# Patient Record
Sex: Female | Born: 1975 | Race: Black or African American | Hispanic: No | Marital: Single | Smoking: Never smoker
Health system: Southern US, Community
[De-identification: ages and names within clinical notes are randomized; demographics above are authoritative.]

---

## 2013-03-16 ENCOUNTER — Emergency Department: Payer: Self-pay | Admitting: Emergency Medicine

## 2015-06-24 DIAGNOSIS — I471 Supraventricular tachycardia, unspecified: Secondary | ICD-10-CM | POA: Insufficient documentation

## 2015-09-18 DIAGNOSIS — M47812 Spondylosis without myelopathy or radiculopathy, cervical region: Secondary | ICD-10-CM | POA: Insufficient documentation

## 2016-02-20 DIAGNOSIS — M2240 Chondromalacia patellae, unspecified knee: Secondary | ICD-10-CM | POA: Insufficient documentation

## 2016-10-08 DIAGNOSIS — S63641A Sprain of metacarpophalangeal joint of right thumb, initial encounter: Secondary | ICD-10-CM | POA: Insufficient documentation

## 2017-11-09 ENCOUNTER — Emergency Department (HOSPITAL_COMMUNITY)
Admission: EM | Admit: 2017-11-09 | Discharge: 2017-11-10 | Disposition: A | Discharge status: Home / Self Care | Payer: Commercial Managed Care - PPO | Attending: Emergency Medicine | Admitting: Emergency Medicine

## 2017-11-09 ENCOUNTER — Other Ambulatory Visit: Payer: Self-pay

## 2017-11-09 DIAGNOSIS — R5381 Other malaise: Secondary | ICD-10-CM | POA: Insufficient documentation

## 2017-11-09 DIAGNOSIS — M79602 Pain in left arm: Secondary | ICD-10-CM | POA: Diagnosis not present

## 2017-11-09 DIAGNOSIS — R110 Nausea: Secondary | ICD-10-CM | POA: Diagnosis not present

## 2017-11-09 NOTE — ED Triage Notes (Signed)
Pt presents to the ED from home with onset of left arm pain and "anxious" feeling.  States she used to be on anxiety medication.  Endorses feeling anxious lately in general. No frank chest pain just "aching" to her left arm.

## 2017-11-10 ENCOUNTER — Emergency Department (HOSPITAL_COMMUNITY): Payer: Commercial Managed Care - PPO

## 2017-11-10 LAB — I-STAT BETA HCG BLOOD, ED (MC, WL, AP ONLY): I-stat hCG, quantitative: <5 m[IU]/mL (ref <5)

## 2017-11-10 LAB — BASIC METABOLIC PANEL
Anion gap: 9 (ref 5–15)
BUN: 8 mg/dL (ref 6–20)
CHLORIDE: 106 mmol/L (ref 98–111)
CO2: 22 mmol/L (ref 22–32)
CREATININE: 0.88 mg/dL (ref 0.44–1.00)
Calcium: 8.9 mg/dL (ref 8.9–10.3)
Glucose, Bld: 128 mg/dL — ABNORMAL HIGH (ref 70–99)
POTASSIUM: 3.3 mmol/L — AB (ref 3.5–5.1)
Sodium: 137 mmol/L (ref 135–145)

## 2017-11-10 LAB — URINALYSIS, ROUTINE W REFLEX MICROSCOPIC
Bilirubin Urine: NEGATIVE
Glucose, UA: NEGATIVE mg/dL
Hgb urine dipstick: NEGATIVE
Ketones, ur: NEGATIVE mg/dL
Leukocytes, UA: NEGATIVE
Nitrite: NEGATIVE
Protein, ur: NEGATIVE mg/dL
Specific Gravity, Urine: 1.015 (ref 1.005–1.030)
pH: 7 (ref 5.0–8.0)

## 2017-11-10 LAB — CBC
HCT: 37.6 % (ref 36.0–46.0)
Hemoglobin: 12.7 g/dL (ref 12.0–15.0)
MCH: 30.4 pg (ref 26.0–34.0)
MCHC: 33.8 g/dL (ref 30.0–36.0)
MCV: 90 fL (ref 78.0–100.0)
Platelets: 366 10*3/uL (ref 150–400)
RBC: 4.18 MIL/uL (ref 3.87–5.11)
RDW: 12.9 % (ref 11.5–15.5)
WBC: 10.8 10*3/uL — ABNORMAL HIGH (ref 4.0–10.5)

## 2017-11-10 LAB — I-STAT TROPONIN, ED
Troponin i, poc: 0 ng/mL (ref 0.00–0.08)
Troponin i, poc: 0 ng/mL (ref 0.00–0.08)

## 2017-11-10 MED ORDER — ONDANSETRON 4 MG PO TBDP: 4.0000 mg | ORAL_TABLET | Freq: Three times a day (TID) | ORAL | 0 refills | Status: DC | PRN

## 2017-11-10 NOTE — Discharge Instructions (Signed)
Rest for today and tomorrow. Take Tylenol and/or ibuprofen for aches. Take Zofran for nausea as needed. Follow up with your doctor for recheck if symptoms persist or return here if symptoms worsen.

## 2017-11-10 NOTE — ED Notes (Signed)
ED Provider at bedside. 

## 2017-11-10 NOTE — ED Provider Notes (Signed)
[facility] EMERGENCY DEPARTMENT Provider Note   CSN: [id number] Arrival date & time: 11/09/17  2346     History   Chief Complaint Chief Complaint  Patient presents with  . Anxiety  . Arm Pain    HPI Heather Wright is a 42 y.o. female.  Patient here for evaluation of left arm ache and "just didn't feel well" while going to bed around 10:30 tonight. She had a normal day otherwise. No cough or fever. No SOB. She reports nausea that has been mild and intermittent. None currently. No chest pain at any time. No history chest pain, cardiac conditions.   The history is provided by the patient. No language interpreter was used.  Anxiety  Pertinent negatives include no chest pain, no abdominal pain and no shortness of breath.  Arm Pain  Pertinent negatives include no chest pain, no abdominal pain and no shortness of breath.    No past medical history on file.  There are no active problems to display for this patient.   OB History   None      Home Medications    Prior to Admission medications   Medication Sig Start Date End Date Taking? Authorizing Provider  ibuprofen (ADVIL,MOTRIN) 200 MG tablet Take 800 mg by mouth every 6 (six) hours as needed for moderate pain.   Yes [provider]  valACYclovir (VALTREX) 500 MG tablet Take 500 mg by mouth daily.   Yes [provider]  ondansetron (ZOFRAN ODT) 4 MG disintegrating tablet Take 1 tablet (4 mg total) by mouth every 8 (eight) hours as needed for nausea or vomiting. 11/10/17   Elpidio Anis, PA-C    Family History No family history on file.  Social History Social History   Tobacco Use  . Smoking status: Not on file  Substance Use Topics  . Alcohol use: Not on file  . Drug use: Not on file     Allergies   Bupropion; Sulfa antibiotics; and Amoxicillin   Review of Systems Review of Systems  Constitutional: Negative for chills and fever.  HENT: Negative.   Respiratory: Negative  for chest tightness and shortness of breath.   Cardiovascular: Negative for chest pain.  Gastrointestinal: Positive for nausea. Negative for abdominal pain.  Musculoskeletal:       C/o left arm ache.  Skin: Negative.   Neurological: Negative.      Physical Exam Updated Vital Signs BP 114/65   Pulse 68   Temp 98.9 F (37.2 C) (Oral)   Resp 18   LMP 10/20/2017   SpO2 96%   Physical Exam  Constitutional: She is oriented to person, place, and time. She appears well-developed and well-nourished.  HENT:  Head: Normocephalic.  Neck: Normal range of motion. Neck supple.  Cardiovascular: Normal rate and regular rhythm.  Pulmonary/Chest: Effort normal and breath sounds normal. She has no wheezes. She has no rales. She exhibits no tenderness.  Abdominal: Soft. Bowel sounds are normal. There is no tenderness. There is no rebound and no guarding.  Musculoskeletal: Normal range of motion. She exhibits no edema or tenderness.  Neurological: She is alert and oriented to person, place, and time.  Skin: Skin is warm and dry. No rash noted.  Psychiatric: She has a normal mood and affect.     ED Treatments / Results  Labs (all labs ordered are listed, but only abnormal results are displayed) Labs Reviewed  BASIC METABOLIC PANEL - Abnormal; Notable for the following components:  Result Value   Potassium 3.3 (*)    Glucose, Bld 128 (*)    All other components within normal limits  CBC - Abnormal; Notable for the following components:   WBC 10.8 (*)    All other components within normal limits  URINALYSIS, ROUTINE W REFLEX MICROSCOPIC  I-STAT TROPONIN, ED  I-STAT BETA HCG BLOOD, ED ([facility], [facility], [facility] ONLY)  I-STAT TROPONIN, ED   Results for orders placed or performed during the hospital encounter of 11/09/17  Basic metabolic panel  Result Value Ref Range   Sodium 137 135 - 145 mmol/L   Potassium 3.3 (L) 3.5 - 5.1 mmol/L   Chloride 106 98 - 111 mmol/L   CO2 22 22 - 32 mmol/L    Glucose, Bld 128 (H) 70 - 99 mg/dL   BUN 8 6 - 20 mg/dL   Creatinine, Ser 9.60 0.44 - 1.00 mg/dL   Calcium 8.9 8.9 - 45.4 mg/dL   GFR calc non Af Amer >60 >60 mL/min   GFR calc Af Amer >60 >60 mL/min   Anion gap 9 5 - 15  CBC  Result Value Ref Range   WBC 10.8 (H) 4.0 - 10.5 K/uL   RBC 4.18 3.87 - 5.11 MIL/uL   Hemoglobin 12.7 12.0 - 15.0 g/dL   HCT 09.8 11.9 - 14.7 %   MCV 90.0 78.0 - 100.0 fL   MCH 30.4 26.0 - 34.0 pg   MCHC 33.8 30.0 - 36.0 g/dL   RDW 82.9 56.2 - 13.0 %   Platelets 366 150 - 400 K/uL  Urinalysis, Routine w reflex microscopic  Result Value Ref Range   Color, Urine YELLOW YELLOW   APPearance CLEAR CLEAR   Specific Gravity, Urine 1.015 1.005 - 1.030   pH 7.0 5.0 - 8.0   Glucose, UA NEGATIVE NEGATIVE mg/dL   Hgb urine dipstick NEGATIVE NEGATIVE   Bilirubin Urine NEGATIVE NEGATIVE   Ketones, ur NEGATIVE NEGATIVE mg/dL   Protein, ur NEGATIVE NEGATIVE mg/dL   Nitrite NEGATIVE NEGATIVE   Leukocytes, UA NEGATIVE NEGATIVE  I-stat troponin, ED  Result Value Ref Range   Troponin i, poc 0.00 0.00 - 0.08 ng/mL   Comment 3          I-Stat beta hCG blood, ED  Result Value Ref Range   I-stat hCG, quantitative <5.0 <5 mIU/mL   Comment 3          I-Stat Troponin, ED (not at [facility])  Result Value Ref Range   Troponin i, poc 0.00 0.00 - 0.08 ng/mL   Comment 3            EKG None EKG: normal EKG, normal sinus rhythm.   Radiology Dg Chest 2 View  Result Date: 11/10/2017 CLINICAL DATA:  42 year old female with chest pain. EXAM: CHEST - 2 VIEW COMPARISON:  Chest radiograph dated 06/18/2015 FINDINGS: The heart size and mediastinal contours are within normal limits. Both lungs are clear. The visualized skeletal structures are unremarkable. IMPRESSION: No active cardiopulmonary disease. Electronically Signed   By: Elgie Collard M.D.   On: 11/10/2017 00:23    Procedures Procedures (including critical care time)  Medications Ordered in ED Medications - No data to  display   Initial Impression / Assessment and Plan / ED Course  I have reviewed the triage vital signs and the nursing notes.  Pertinent labs & imaging results that were available during my care of the patient were reviewed by me and considered in my medical decision making (see  chart for details).     Patient here with left arm ache and generally not feeling well since 10:30 pm 11/09/17 (4 hours ago). No significant risk factors for heart disease. Heart Score 0-1 based on symptoms. Troponin and repeat trop both negative.   She is felt stable for discharge home and is encouraged to follow up with her PCP for recheck of symptoms.  Final Clinical Impressions(s) / ED Diagnoses   Final diagnoses:  Nausea  Malaise  Left arm pain   1. Nausea. 2. Malaise 3. Left arm pain  ED Discharge Orders        Ordered    ondansetron (ZOFRAN ODT) 4 MG disintegrating tablet  Every 8 hours PRN     11/10/17 0311       Elpidio AnisUpstill, Kensly Bowmer, PA-C 11/10/17 0701    Derwood KaplanNanavati, Ankit, MD 11/10/17 0730

## 2019-08-12 ENCOUNTER — Emergency Department (HOSPITAL_COMMUNITY)
Admission: EM | Admit: 2019-08-12 | Discharge: 2019-08-12 | Disposition: A | Discharge status: Home / Self Care | Payer: Commercial Managed Care - PPO | Attending: Emergency Medicine | Admitting: Emergency Medicine

## 2019-08-12 ENCOUNTER — Other Ambulatory Visit: Payer: Self-pay

## 2019-08-12 ENCOUNTER — Emergency Department (HOSPITAL_COMMUNITY): Payer: Commercial Managed Care - PPO

## 2019-08-12 DIAGNOSIS — R002 Palpitations: Secondary | ICD-10-CM | POA: Diagnosis present

## 2019-08-12 DIAGNOSIS — I471 Supraventricular tachycardia: Secondary | ICD-10-CM | POA: Diagnosis not present

## 2019-08-12 LAB — BASIC METABOLIC PANEL
Anion gap: 9 (ref 5–15)
BUN: 10 mg/dL (ref 6–20)
CO2: 24 mmol/L (ref 22–32)
Calcium: 9.1 mg/dL (ref 8.9–10.3)
Chloride: 106 mmol/L (ref 98–111)
Creatinine, Ser: 0.93 mg/dL (ref 0.44–1.00)
GFR calc Af Amer: >60 mL/min (ref >60)
GFR calc non Af Amer: >60 mL/min (ref >60)
Glucose, Bld: 108 mg/dL — ABNORMAL HIGH (ref 70–99)
Potassium: 3.7 mmol/L (ref 3.5–5.1)
Sodium: 139 mmol/L (ref 135–145)

## 2019-08-12 LAB — CBC
HCT: 40.5 % (ref 36.0–46.0)
Hemoglobin: 13.6 g/dL (ref 12.0–15.0)
MCH: 30.6 pg (ref 26.0–34.0)
MCHC: 33.6 g/dL (ref 30.0–36.0)
MCV: 91.2 fL (ref 80.0–100.0)
Platelets: 349 10*3/uL (ref 150–400)
RBC: 4.44 MIL/uL (ref 3.87–5.11)
RDW: 12.6 % (ref 11.5–15.5)
WBC: 8.3 10*3/uL (ref 4.0–10.5)
nRBC: 0 % (ref 0.0–0.2)

## 2019-08-12 LAB — I-STAT BETA HCG BLOOD, ED (MC, WL, AP ONLY): I-stat hCG, quantitative: <5 m[IU]/mL (ref <5)

## 2019-08-12 LAB — TSH: TSH: 2.523 u[IU]/mL (ref 0.350–4.500)

## 2019-08-12 MED ORDER — METOPROLOL TARTRATE 25 MG PO TABS: 25.0000 mg | ORAL_TABLET | Freq: Two times a day (BID) | ORAL | 0 refills | Status: DC

## 2019-08-12 MED ORDER — METOPROLOL TARTRATE 5 MG/5ML IV SOLN: 5.0000 mg | Freq: Once | INTRAVENOUS | Status: DC

## 2019-08-12 MED ORDER — METOPROLOL TARTRATE 5 MG/5ML IV SOLN
5.0000 mg | Freq: Once | INTRAVENOUS | Status: AC
  Administered 2019-08-12: 08:00:00 5 mg via INTRAVENOUS
  Filled 2019-08-12: qty 5

## 2019-08-12 MED ORDER — METOPROLOL TARTRATE 5 MG/5ML IV SOLN
5.0000 mg | Freq: Once | INTRAVENOUS | Status: AC
  Administered 2019-08-12: 07:00:00 5 mg via INTRAVENOUS
  Filled 2019-08-12: qty 5

## 2019-08-12 MED ORDER — SODIUM CHLORIDE 0.9% FLUSH
3.0000 mL | Freq: Once | INTRAVENOUS | Status: AC
  Administered 2019-08-12: 3 mL via INTRAVENOUS

## 2019-08-12 NOTE — ED Notes (Signed)
Patient verbalizes understanding of discharge instructions. Opportunity for questioning and answers were provided. Armband removed by staff, pt discharged from ED. Ambulated out to lobby  

## 2019-08-12 NOTE — ED Provider Notes (Signed)
[place] EMERGENCY DEPARTMENT Provider Note   CSN: [id number] Arrival date & time: 08/12/19  0257     History Chief Complaint  Patient presents with  . Tachycardia    Heather Wright is a 44 y.o. female.  Patient is a 44 year old female with history of SVT presenting with complaints of palpitations. This started at approximately 130 this morning and woke her from sleep. She denies any chest pain or difficulty breathing. She denies any swelling in her legs. She has been seen by a cardiologist in [facility] in the past and was treated for presumed SVT. She had been on metoprolol, however she has been off of this for the past 2 years and has not had any issues during this period of time.  The history is provided by the patient.       No past medical history on file.  There are no problems to display for this patient.      OB History   No obstetric history on file.     No family history on file.  Social History   Tobacco Use  . Smoking status: Not on file  Substance Use Topics  . Alcohol use: Not on file  . Drug use: Not on file    Home Medications Prior to Admission medications   Medication Sig Start Date End Date Taking? Authorizing Provider  ibuprofen (ADVIL,MOTRIN) 200 MG tablet Take 800 mg by mouth every 6 (six) hours as needed for moderate pain.    [provider]  ondansetron (ZOFRAN ODT) 4 MG disintegrating tablet Take 1 tablet (4 mg total) by mouth every 8 (eight) hours as needed for nausea or vomiting. 11/10/17   Charlann Lange, PA-C  valACYclovir (VALTREX) 500 MG tablet Take 500 mg by mouth daily.    [provider]    Allergies    Bupropion, Sulfa antibiotics, and Amoxicillin  Review of Systems   Review of Systems  All other systems reviewed and are negative.   Physical Exam Updated Vital Signs BP (!) 141/96 (BP Location: Left Arm)   Pulse (!) 113   Temp 97.8 F (36.6 C) (Oral)   Resp 20   SpO2 99%    Physical Exam Vitals and nursing note reviewed.  Constitutional:      General: She is not in acute distress.    Appearance: She is well-developed. She is not diaphoretic.  HENT:     Head: Normocephalic and atraumatic.  Cardiovascular:     Rate and Rhythm: Regular rhythm. Tachycardia present.     Heart sounds: No murmur. No friction rub. No gallop.   Pulmonary:     Effort: Pulmonary effort is normal. No respiratory distress.     Breath sounds: Normal breath sounds. No wheezing.  Abdominal:     General: Bowel sounds are normal. There is no distension.     Palpations: Abdomen is soft.     Tenderness: There is no abdominal tenderness.  Musculoskeletal:        General: Normal range of motion.     Cervical back: Normal range of motion and neck supple.  Skin:    General: Skin is warm and dry.  Neurological:     Mental Status: She is alert and oriented to person, place, and time.     ED Results / Procedures / Treatments   Labs (all labs ordered are listed, but only abnormal results are displayed) Labs Reviewed  BASIC METABOLIC PANEL - Abnormal; Notable for the following components:  Result Value   Glucose, Bld 108 (*)    All other components within normal limits  CBC  TSH  I-STAT BETA HCG BLOOD, ED ([facility], [facility], [facility] ONLY)    EKG EKG Interpretation  Date/Time:  Saturday August 12 2019 03:06:37 EDT Ventricular Rate:  113 PR Interval:    QRS Duration: 58 QT Interval:  326 QTC Calculation: 447 R Axis:   80 Text Interpretation: Atrial Flutter T wave abnormality, consider inferior ischemia Abnormal ECG Confirmed by Geoffery Lyons ([id number]) on 08/12/2019 5:25:56 AM   Radiology DG Chest 2 View  Result Date: 08/12/2019 CLINICAL DATA:  Tachycardia. EXAM: CHEST - 2 VIEW COMPARISON:  Chest radiograph 11/10/2017 FINDINGS: The cardiomediastinal contours are normal. The lungs are clear. Pulmonary vasculature is normal. No consolidation, pleural effusion, or pneumothorax. No acute  osseous abnormalities are seen. EKG leads overlie the chest. IMPRESSION: Negative radiographs of the chest. Electronically Signed   By: Narda Rutherford M.D.   On: 08/12/2019 03:37    Procedures Procedures (including critical care time)  Medications Ordered in ED Medications  sodium chloride flush (NS) 0.9 % injection 3 mL (has no administration in time range)  metoprolol tartrate (LOPRESSOR) injection 5 mg (has no administration in time range)    ED Course  I have reviewed the triage vital signs and the nursing notes.  Pertinent labs & imaging results that were available during my care of the patient were reviewed by me and considered in my medical decision making (see chart for details).    MDM Rules/Calculators/A&P  Patient presenting here with complaints of palpitations.  She arrives with an EKG showing a possible atrial flutter versus junctional tachycardia.  Patient has had this in the past and had previously been on Lopressor.  Patient given IV Lopressor here in the ER and observed.  She remains in what appears to be a junctional tachycardia.  She will receive a second dose and undergo further observation.  I have discussed the care with Dr. Wyline Mood from cardiology who has reviewed her EKG and agrees with my assessment.  The plan at this time is to give additional Lopressor and discharged the patient with metoprolol.  Final Clinical Impression(s) / ED Diagnoses Final diagnoses:  None    Rx / DC Orders ED Discharge Orders    None       Geoffery Lyons, MD 08/12/19 [phone]

## 2019-08-12 NOTE — ED Provider Notes (Signed)
Care assumed from Dr. Judd Lien.  At time of transfer care, patient already received Lopressor and had reassuring work-up.  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  Plan of care is to watch patient for several hours to make sure she has no further episodes of the severe tachycardia and discharged home to follow-up with outpatient cardiology with beta-blocker prescription.  Patient was agreeable with this plan previously.  Anticipate reassessment and monitoring for new symptoms.  12:31 PM After several hours of observation, patient vital signs improved.  She had a blood pressure 121/86 on my evaluation and remained with a heart rate in the 90s.  She had improved symptoms.  Patient will follow up with cardiology and use the metoprolol prescription that was sent to her pharmacy.  She understands return precautions for new or worsened symptoms and will maintain hydration at home.  She had no other worsens or concerns and was discharged in good condition per previous plan.   Clinical Impression: 1. Palpitations   2. Junctional tachycardia (HCC)     Disposition: Discharge  Condition: Good  I have discussed the results, Dx and Tx plan with the pt(& family if present). He/she/they expressed understanding and agree(s) with the plan. Discharge instructions discussed at great length. Strict return precautions discussed and pt &/or family have verbalized understanding of the instructions. No further questions at time of discharge.    New Prescriptions   METOPROLOL TARTRATE (LOPRESSOR) 25 MG TABLET    Take 1 tablet (25 mg total) by mouth 2 (two) times daily.    Follow Up: No follow-up provider specified.    Joneisha Miles, Canary Brim, MD 08/12/19 [phone]

## 2019-08-12 NOTE — Discharge Instructions (Signed)
Begin taking metoprolol as prescribed.  Follow-up with your cardiologist next week, and return to the ER if symptoms significantly worsen or change.

## 2019-08-12 NOTE — ED Notes (Signed)
Pt tolerated PO fluids and crackers

## 2019-08-12 NOTE — ED Triage Notes (Signed)
Per pt she woke up with her heart racing about 1:30 am. Pt said no chest pain no sob. Pt is having some headaches and she feels like her heart is beating in her throat.

## 2019-08-18 ENCOUNTER — Ambulatory Visit: Payer: Commercial Managed Care - PPO | Admitting: Cardiology

## 2019-08-18 ENCOUNTER — Encounter: Payer: Self-pay | Admitting: Cardiology

## 2019-08-18 ENCOUNTER — Other Ambulatory Visit: Payer: Self-pay

## 2019-08-18 VITALS — BP 116/70 | HR 67 | Ht 65.0 in | Wt 155.8 lb

## 2019-08-18 DIAGNOSIS — R002 Palpitations: Secondary | ICD-10-CM | POA: Diagnosis not present

## 2019-08-18 DIAGNOSIS — I471 Supraventricular tachycardia: Secondary | ICD-10-CM | POA: Diagnosis not present

## 2019-08-18 NOTE — Patient Instructions (Signed)
Medication Instructions:  NO CHANGES  *If you need a refill on your cardiac medications before your next appointment, please call your pharmacy*   Lab Work: NOT NEEDED   Testing/Procedures: WILL BE SCHEDULE AT [address] Your physician has requested that you have an echocardiogram. Echocardiography is a painless test that uses sound waves to create images of your heart. It provides your doctor with information about the size and shape of your heart and how well your heart's chambers and valves are working. This procedure takes approximately one hour. There are no restrictions for this procedure.  AND  Your physician has recommended that you wear a 14 DAY ZIO-PATCH monitor. The Zio patch cardiac monitor continuously records heart rhythm data for up to 14 days, this is for patients being evaluated for multiple types heart rhythms. For the first 24 hours post application, please avoid getting the Zio monitor wet in the shower or by excessive sweating during exercise. After that, feel free to carry on with regular activities. Keep soaps and lotions away from the ZIO XT Patch.  This will be mailed to you, please expect 7-10 days to receive.   [address] location - [address], [address].         Follow-Up: At [facility], you and your health needs are our priority.  As part of our continuing mission to provide you with exceptional heart care, we have created designated Provider Care Teams.  These Care Teams include your primary Cardiologist (physician) and Advanced Practice Providers (APPs -  Physician Assistants and Nurse Practitioners) who all work together to provide you with the care you need, when you need it.  We recommend signing up for the patient portal called "MyChart".  Sign up information is provided on this After Visit Summary.  MyChart is used to connect with patients for Virtual Visits (Telemedicine).  Patients are able to view lab/test results,  encounter notes, upcoming appointments, etc.  Non-urgent messages can be sent to your provider as well.   To learn more about what you can do with MyChart, go to [email or web address].    Your next appointment:   3 month(s)  The format for your next appointment:   In Person  Provider:   Christopher Schumann, MD   Other Instructions   ZIO XT- Long Term Monitor Instructions   Your physician has requested you wear your ZIO patch monitor___14 ____days.   This is a single patch monitor.  [employer] supplies one patch monitor per enrollment.  Additional stickers are not available.   Please do not apply patch if you will be having a Nuclear Stress Test, Echocardiogram, Cardiac CT, MRI, or Chest Xray during the time frame you would be wearing the monitor. The patch cannot be worn during these tests.  You cannot remove and re-apply the ZIO XT patch monitor.   Your ZIO patch monitor will be sent USPS Priority mail from [employer] directly to your home address. The monitor may also be mailed to a PO BOX if home delivery is not available.   It may take 3-5 days to receive your monitor after you have been enrolled.   Once you have received you monitor, please review enclosed instructions.  Your monitor has already been registered assigning a specific monitor serial # to you.   Applying the monitor   Shave hair from upper left chest.   Hold abrader disc by orange tab.  Rub abrader in 40 strokes over left upper   chest as indicated in your monitor instructions.   Clean area with 4 enclosed alcohol pads .  Use all pads to assure are is cleaned thoroughly.  Let dry.   Apply patch as indicated in monitor instructions.  Patch will be place under collarbone on left side of chest with arrow pointing upward.   Rub patch adhesive wings for 2 minutes.Remove white label marked "1".  Remove white label marked "2".  Rub patch adhesive wings for 2 additional minutes.   While looking in a mirror,  press and release button in center of patch.  A small green light will flash 3-4 times .  This will be your only indicator the monitor has been turned on.     Do not shower for the first 24 hours.  You may shower after the first 24 hours.   Press button if you feel a symptom. You will hear a small click.  Record Date, Time and Symptom in the Patient Log Book.   When you are ready to remove patch, follow instructions on last 2 pages of Patient Log Book.  Stick patch monitor onto last page of Patient Log Book.   Place Patient Log Book in [employer] box.  Use locking tab on box and tape box closed securely.  The [employer] has [employer] on it.  Please place in mailbox as soon as possible.  Your physician should have your test results approximately 7 days after the monitor has been mailed back to [facility].   Call [facility] Customer Care at [phone] if you have questions regarding your ZIO XT patch monitor.  Call them immediately if you see an orange light blinking on your monitor.   If your monitor falls off in less than 4 days contact our Monitor department at [phone].  If your monitor becomes loose or falls off after 4 days call [employer] at [phone] for suggestions on securing your monitor.

## 2019-08-18 NOTE — Progress Notes (Signed)
Cardiology Office Note:    Date:  08/18/2019   ID:  Franchot Erichsen, DOB 12-09-1975, MRN [id number]  PCP:  Verlon Au, MD  Cardiologist:  Little Ishikawa, MD  Electrophysiologist:  None   Referring MD: Verlon Au, MD   Chief Complaint  Patient presents with  . Irregular Heart Beat    History of Present Illness:    Heather Wright is a 44 y.o. female with a hx of SVT who had recent ED visit for palpitations.  Presented on 3/27 to the ED with palpitations.  She had been on metoprolol in the past for SVT, but had been off it for the past 2 years.  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  She was given IV metoprolol and discharged on p.o. metoprolol.  She reports that she was diagnosed with SVT 6 years ago.  She felt like her heart was pounding and went to the ED.  Episode lasted for about an hour.  Reports that she had multiple episodes of this after her diagnosis, but had not had an episode for the last 2 years.  On 3/27 she woke up at 1:30 AM and felt like she had been running.  She tried coughing and bearing down, but no change in her symptoms.  Reports heart rate was going from 90s to 110s.  She went to the ED, where she was given IV metoprolol and then discharged on p.o. metoprolol.  States that episode broke shortly after discharge from the ED. Since starting metoprolol 25 mg twice daily, she has not had further episodes.  No alcohol or smoking.  Rare caffeine intake.    No past medical history on file.    Current Medications: Current Meds  Medication Sig  . ibuprofen (ADVIL,MOTRIN) 200 MG tablet Take 800 mg by mouth every 6 (six) hours as needed for moderate pain.  . metoprolol tartrate (LOPRESSOR) 25 MG tablet Take 1 tablet (25 mg total) by mouth 2 (two) times daily.  . valACYclovir (VALTREX) 500 MG tablet Take 500 mg by mouth daily.     Allergies:   Bupropion, Sulfa antibiotics, and Amoxicillin   Social History   Socioeconomic History  .  Marital status: Single    Spouse name: Not on file  . Number of children: Not on file  . Years of education: Not on file  . Highest education level: Not on file  Occupational History  . Not on file  Tobacco Use  . Smoking status: Never Smoker  . Smokeless tobacco: Never Used  Substance and Sexual Activity  . Alcohol use: Not on file  . Drug use: Not on file  . Sexual activity: Not on file  Other Topics Concern  . Not on file  Social History Narrative  . Not on file   Social Determinants of Health   Financial Resource Strain:   . Difficulty of Paying Living Expenses:   Food Insecurity:   . Worried About Programme researcher, broadcasting/film/video in the Last Year:   . Barista in the Last Year:   Transportation Needs:   . Freight forwarder (Medical):   Marland Kitchen Lack of Transportation (Non-Medical):   Physical Activity:   . Days of Exercise per Week:   . Minutes of Exercise per Session:   Stress:   . Feeling of Stress :   Social Connections:   . Frequency of Communication with Friends and Family:   . Frequency of Social Gatherings with Friends and Family:   .  Attends Religious Services:   . Active Member of Clubs or Organizations:   . Attends Banker Meetings:   Marland Kitchen Marital Status:      Family History: The patient's family history is not on file.  ROS:   Please see the history of present illness.     All other systems reviewed and are negative.  EKGs/Labs/Other Studies Reviewed:    The following studies were reviewed today:   EKG:  EKG is ordered today.  The ekg ordered today demonstrates normal sinus rhythm, rate 67, no ST/T abnormalities  Recent Labs: 08/12/2019: BUN 10; Creatinine, Ser 0.93; Hemoglobin 13.6; Platelets 349; Potassium 3.7; Sodium 139; TSH 2.523  Recent Lipid Panel No results found for: CHOL, TRIG, HDL, CHOLHDL, VLDL, LDLCALC, LDLDIRECT  Physical Exam:    VS:  BP 116/70   Pulse 67   Ht 5\' 5"  (1.651 m)   Wt 155 lb 12.8 oz (70.7 kg)   LMP  08/08/2019   SpO2 96%   BMI 25.93 kg/m     Wt Readings from Last 3 Encounters:  08/18/19 155 lb 12.8 oz (70.7 kg)     GEN:  Well nourished, well developed in no acute distress HEENT: Normal NECK: No JVD; No carotid bruits LYMPHATICS: No lymphadenopathy CARDIAC: RRR, no murmurs, rubs, gallops RESPIRATORY:  Clear to auscultation without rales, wheezing or rhonchi  ABDOMEN: Soft, non-tender, non-distended MUSCULOSKELETAL:  No edema; No deformity  SKIN: Warm and dry NEUROLOGIC:  Alert and oriented x 3 PSYCHIATRIC:  Normal affect   ASSESSMENT:    1. Palpitations   2. SVT (supraventricular tachycardia) (HCC)    PLAN:     Palpitations/SVT:  Recent ED visit with palpitations, with EKG suggestive of junctional tachycardia.  Started on metoprolol 25 mg twice daily, has not had further episodes since starting metoprolol -TTE -Zio patch x14 days -Continue metoprolol 25 mg twice daily  RTC in 3 months  Medication Adjustments/Labs and Tests Ordered: Current medicines are reviewed at length with the patient today.  Concerns regarding medicines are outlined above.  Orders Placed This Encounter  Procedures  . LONG TERM MONITOR (3-14 DAYS)  . EKG 12-Lead  . ECHOCARDIOGRAM COMPLETE   No orders of the defined types were placed in this encounter.   Patient Instructions  Medication Instructions:  NO CHANGES  *If you need a refill on your cardiac medications before your next appointment, please call your pharmacy*   Lab Work: NOT NEEDED   Testing/Procedures: WILL BE SCHEDULE AT [address] Your physician has requested that you have an echocardiogram. Echocardiography is a painless test that uses sound waves to create images of your heart. It provides your doctor with information about the size and shape of your heart and how well your heart's chambers and valves are working. This procedure takes approximately one hour. There are no restrictions for this  procedure.  AND Your physician has recommended that you wear a 14  DAY ZIO-PATCH monitor. The Zio patch cardiac monitor continuously records heart rhythm data for up to 14 days, this is for patients being evaluated for multiple types heart rhythms. For the first 24 hours post application, please avoid getting the Zio monitor wet in the shower or by excessive sweating during exercise. After that, feel free to carry on with regular activities. Keep soaps and lotions away from the ZIO XT Patch.  This will be mailed to you, please expect 7-10 days to receive.  10/18/19 location - [address], [address].          Follow-Up: At [facility], you and your health needs are our priority.  As part of our continuing mission to provide you with exceptional heart care, we have created designated Provider Care Teams.  These Care Teams include your primary Cardiologist (physician) and Advanced Practice Providers (APPs -  Physician Assistants and Nurse Practitioners) who all work together to provide you with the care you need, when you need it.  We recommend signing up for the patient portal called "MyChart".  Sign up information is provided on this After Visit Summary.  MyChart is used to connect with patients for Virtual Visits (Telemedicine).  Patients are able to view lab/test results, encounter notes, upcoming appointments, etc.  Non-urgent messages can be sent to your provider as well.   To learn more about what you can do with MyChart, go to [email or web address].    Your next appointment:   3 month(s)  The format for your next appointment:   In Person  Provider:   Oswaldo Milian, MD   Other Instructions    Bryn Gulling- Long Term Monitor Instructions   Your physician has requested you wear your ZIO patch monitor__14_____days.   This is a single patch monitor.  [employer] supplies one patch monitor per enrollment.  Additional stickers are not available.   Please do not apply patch  if you will be having a Nuclear Stress Test, Echocardiogram, Cardiac CT, MRI, or Chest Xray during the time frame you would be wearing the monitor. The patch cannot be worn during these tests.  You cannot remove and re-apply the ZIO XT patch monitor.   Your ZIO patch monitor will be sent USPS Priority mail from [facility] directly to your home address. The monitor may also be mailed to a PO BOX if home delivery is not available.   It may take 3-5 days to receive your monitor after you have been enrolled.   Once you have received you monitor, please review enclosed instructions.  Your monitor has already been registered assigning a specific monitor serial # to you.   Applying the monitor   Shave hair from upper left chest.   Hold abrader disc by orange tab.  Rub abrader in 40 strokes over left upper chest as indicated in your monitor instructions.   Clean area with 4 enclosed alcohol pads .  Use all pads to assure are is cleaned thoroughly.  Let dry.   Apply patch as indicated in monitor instructions.  Patch will be place under collarbone on left side of chest with arrow pointing upward.   Rub patch adhesive wings for 2 minutes.Remove white label marked "1".  Remove white label marked "2".  Rub patch adhesive wings for 2 additional minutes.   While looking in a mirror, press and release button in center of patch.  A small green light will flash 3-4 times .  This will be your only indicator the monitor has been turned on.     Do not shower for the first 24 hours.  You may shower after the first 24 hours.   Press button if you feel a symptom. You will hear a small click.  Record Date, Time and Symptom in the Patient Log Book.   When you are ready to remove patch, follow instructions on last 2 pages of Patient Log Book.  Stick patch monitor onto last page of Patient Log Book.   Place Patient Log Book in [place] box.  Use locking tab on box and tape box closed securely.  The [employer] and  [employer] has [employer] on it.  Please place in mailbox as soon as possible.  Your physician should have your test results approximately 7 days after the monitor has been mailed back to [facility].   Call [facility] Customer Care at [phone] if you have questions regarding your ZIO XT patch monitor.  Call them immediately if you see an orange light blinking on your monitor.   If your monitor falls off in less than 4 days contact our Monitor department at [phone].  If your monitor becomes loose or falls off after 4 days call [employer] at [phone] for suggestions on securing your monitor.       Signed, Little Ishikawa, MD  08/18/2019 9:14 PM    [facility]

## 2019-08-21 ENCOUNTER — Telehealth: Payer: Self-pay | Admitting: Radiology

## 2019-08-21 NOTE — Telephone Encounter (Signed)
Enrolled patient for a 14 day Zio monitor to be mailed to patients home.  

## 2019-08-25 ENCOUNTER — Ambulatory Visit: Payer: Commercial Managed Care - PPO

## 2019-08-25 DIAGNOSIS — R002 Palpitations: Secondary | ICD-10-CM

## 2019-09-04 ENCOUNTER — Other Ambulatory Visit (HOSPITAL_COMMUNITY): Payer: Commercial Managed Care - PPO

## 2019-09-11 ENCOUNTER — Ambulatory Visit (HOSPITAL_COMMUNITY): Payer: Commercial Managed Care - PPO | Attending: Internal Medicine

## 2019-09-11 ENCOUNTER — Other Ambulatory Visit: Payer: Self-pay

## 2019-09-11 DIAGNOSIS — R002 Palpitations: Secondary | ICD-10-CM | POA: Insufficient documentation

## 2019-09-12 ENCOUNTER — Encounter: Payer: Self-pay | Admitting: *Deleted

## 2019-10-04 ENCOUNTER — Telehealth: Payer: Self-pay | Admitting: Cardiology

## 2019-10-04 MED ORDER — METOPROLOL TARTRATE 50 MG PO TABS: 50.0000 mg | ORAL_TABLET | Freq: Two times a day (BID) | ORAL | 3 refills | Status: DC

## 2019-10-04 NOTE — Telephone Encounter (Signed)
New message  Patient is returning call for her monitor results. Please give patient a call back.

## 2019-10-04 NOTE — Telephone Encounter (Signed)
Patient aware of monitor results and recommendations.   Updated rx sent to pharmacy and f/u appt made.  Advised if episodes continue, symptoms worsen, or become more frequent call office for sooner appt.   Patient verbalized understanding.

## 2019-11-26 NOTE — Progress Notes (Deleted)
Cardiology Office Note:    Date:  11/26/2019   ID:  Franchot Erichsen, DOB January 31, 1976, MRN [id number]  PCP:  Verlon Au, MD  Cardiologist:  Little Ishikawa, MD  Electrophysiologist:  None   Referring MD: Verlon Au, MD   No chief complaint on file.   History of Present Illness:    Heather Wright is a 44 y.o. female with a hx of SVT who presents for follow-up.  She was initially seen on 08/18/2019.  She had presented on 3/27 to the ED with palpitations.  She had been on metoprolol in the past for SVT, but had been off it for the past 2 years.  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  She was given IV metoprolol and discharged on p.o. metoprolol.  She reports that she was diagnosed with SVT in 2015.  She felt like her heart was pounding and went to the ED.  Episode lasted for about an hour.  Reports that she had multiple episodes of this after her diagnosis, but had not had an episode for the last 2 years.  On 3/27 she woke up at 1:30 AM and felt like she had been running.  She tried coughing and bearing down, but no change in her symptoms.  Reports heart rate was going from 90s to 110s.  She went to the ED, where she was given IV metoprolol and then discharged on p.o. metoprolol.  States that episode broke shortly after discharge from the ED. Since starting metoprolol 25 mg twice daily, she has not had further episodes.  No alcohol or smoking.  Rare caffeine intake.  Echocardiogram on 09/11/2019 showed normal biventricular function, mild mitral regurgitation.  Cardiac monitor on 09/26/2019 showed 2 episodes of SVT, longest lasting 1 hour 54 minutes with max heart rate 124 bpm.  No past medical history on file.    Current Medications: No outpatient medications have been marked as taking for the 11/27/19 encounter (Appointment) with Little Ishikawa, MD.     Allergies:   Bupropion, Sulfa antibiotics, and Amoxicillin   Social History   Socioeconomic History   . Marital status: Single    Spouse name: Not on file  . Number of children: Not on file  . Years of education: Not on file  . Highest education level: Not on file  Occupational History  . Not on file  Tobacco Use  . Smoking status: Never Smoker  . Smokeless tobacco: Never Used  Substance and Sexual Activity  . Alcohol use: Not on file  . Drug use: Not on file  . Sexual activity: Not on file  Other Topics Concern  . Not on file  Social History Narrative  . Not on file   Social Determinants of Health   Financial Resource Strain:   . Difficulty of Paying Living Expenses:   Food Insecurity:   . Worried About Programme researcher, broadcasting/film/video in the Last Year:   . Barista in the Last Year:   Transportation Needs:   . Freight forwarder (Medical):   Marland Kitchen Lack of Transportation (Non-Medical):   Physical Activity:   . Days of Exercise per Week:   . Minutes of Exercise per Session:   Stress:   . Feeling of Stress :   Social Connections:   . Frequency of Communication with Friends and Family:   . Frequency of Social Gatherings with Friends and Family:   . Attends Religious Services:   . Active Member of Clubs  or Organizations:   . Attends Banker Meetings:   Marland Kitchen Marital Status:      Family History: The patient's family history is not on file.  ROS:   Please see the history of present illness.     All other systems reviewed and are negative.  EKGs/Labs/Other Studies Reviewed:    The following studies were reviewed today:   EKG:  EKG is ordered today.  The ekg ordered today demonstrates normal sinus rhythm, rate 67, no ST/T abnormalities  Recent Labs: 08/12/2019: BUN 10; Creatinine, Ser 0.93; Hemoglobin 13.6; Platelets 349; Potassium 3.7; Sodium 139; TSH 2.523  Recent Lipid Panel No results found for: CHOL, TRIG, HDL, CHOLHDL, VLDL, LDLCALC, LDLDIRECT  Physical Exam:    VS:  There were no vitals taken for this visit.    Wt Readings from Last 3 Encounters:    08/18/19 155 lb 12.8 oz (70.7 kg)     GEN:  Well nourished, well developed in no acute distress HEENT: Normal NECK: No JVD; No carotid bruits LYMPHATICS: No lymphadenopathy CARDIAC: RRR, no murmurs, rubs, gallops RESPIRATORY:  Clear to auscultation without rales, wheezing or rhonchi  ABDOMEN: Soft, non-tender, non-distended MUSCULOSKELETAL:  No edema; No deformity  SKIN: Warm and dry NEUROLOGIC:  Alert and oriented x 3 PSYCHIATRIC:  Normal affect   ASSESSMENT:    No diagnosis found. PLAN:     Palpitations/SVT: Recent Zio patch x14 days showed 2 episodes of SVT, longest lasting nearly 2 hours with max rate 124 bpm.  Echocardiogram shows no structural heart disease. -Continue metoprolol 50 mg twice daily.  If persistent symptoms despite medical management then will plan on EP referral for evaluation for ablation  RTC in 3 months  Medication Adjustments/Labs and Tests Ordered: Current medicines are reviewed at length with the patient today.  Concerns regarding medicines are outlined above.  No orders of the defined types were placed in this encounter.  No orders of the defined types were placed in this encounter.   There are no Patient Instructions on file for this visit.   Signed, Little Ishikawa, MD  11/26/2019 9:33 PM    [facility]

## 2019-11-27 ENCOUNTER — Ambulatory Visit: Payer: Commercial Managed Care - PPO | Admitting: Cardiology

## 2020-01-14 NOTE — Progress Notes (Addendum)
Cardiology Office Note:    Date:  01/17/2020   ID:  Franchot Erichsen, DOB Sep 17, 1975, MRN [id number]  PCP:  Verlon Au, MD  Cardiologist:  Little Ishikawa, MD  Electrophysiologist:  None   Referring MD: Verlon Au, MD   Chief Complaint  Patient presents with  . Palpitations    History of Present Illness:    Heather Wright is a 44 y.o. female with a hx of SVT who presents for follow-up.  She was initially seen on 08/18/2019.  Presented on 08/12/19 to the ED with palpitations.  She had been on metoprolol in the past for SVT, but had been off it for the past 2 years.  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  She was given IV metoprolol and discharged on p.o. metoprolol.  She reports that she was diagnosed with SVT around 2015.  She felt like her heart was pounding and went to the ED.  Episode lasted for about an hour.  Reports that she had multiple episodes of this after her diagnosis, but had not had an episode for the last 2 years.  On 3/27 she woke up at 1:30 AM and felt like she had been running.  She tried coughing and bearing down, but no change in her symptoms.  Reports heart rate was going from 90s to 110s.  She went to the ED, where she was given IV metoprolol and then discharged on p.o. metoprolol.  States that episode broke shortly after discharge from the ED. Since starting metoprolol 25 mg twice daily, she has not had further episodes.  No alcohol or smoking.  Rare caffeine intake.  Echocardiogram on 09/11/2019 showed normal biventricular function, mild MR.  Zio patch x14 days showed 2 episodes of SVT, longest lasting 2 hours.  Since last clinic visit, she reports that since she increased her metoprolol to 50 mg twice daily, she has had one episode of SVT.  Occurred yesterday morning around 4 AM, states that it broke using vagal maneuvers.  She has not been taking her Lopressor dose in the evening, as states that it causes her to feel pressure in her head.   Has only been taking her morning dose.  She denies any lightheadedness, syncope, chest pain, or dyspnea.   No past medical history on file.    Current Medications: Current Meds  Medication Sig  . valACYclovir (VALTREX) 500 MG tablet Take 500 mg by mouth daily.  . [DISCONTINUED] metoprolol tartrate (LOPRESSOR) 50 MG tablet Take 1 tablet (50 mg total) by mouth 2 (two) times daily.     Allergies:   Bupropion, Sulfa antibiotics, and Amoxicillin   Social History   Socioeconomic History  . Marital status: Single    Spouse name: Not on file  . Number of children: Not on file  . Years of education: Not on file  . Highest education level: Not on file  Occupational History  . Not on file  Tobacco Use  . Smoking status: Never Smoker  . Smokeless tobacco: Never Used  Substance and Sexual Activity  . Alcohol use: Not on file  . Drug use: Not on file  . Sexual activity: Not on file  Other Topics Concern  . Not on file  Social History Narrative  . Not on file   Social Determinants of Health   Financial Resource Strain:   . Difficulty of Paying Living Expenses: Not on file  Food Insecurity:   . Worried About Programme researcher, broadcasting/film/video in the  Last Year: Not on file  . Ran Out of Food in the Last Year: Not on file  Transportation Needs:   . Lack of Transportation (Medical): Not on file  . Lack of Transportation (Non-Medical): Not on file  Physical Activity:   . Days of Exercise per Week: Not on file  . Minutes of Exercise per Session: Not on file  Stress:   . Feeling of Stress : Not on file  Social Connections:   . Frequency of Communication with Friends and Family: Not on file  . Frequency of Social Gatherings with Friends and Family: Not on file  . Attends Religious Services: Not on file  . Active Member of Clubs or Organizations: Not on file  . Attends Banker Meetings: Not on file  . Marital Status: Not on file     Family History: The patient's family history is  not on file.  ROS:   Please see the history of present illness.     All other systems reviewed and are negative.  EKGs/Labs/Other Studies Reviewed:    The following studies were reviewed today:   EKG:  EKG is ordered today.  The ekg ordered today demonstrates normal sinus rhythm, rate 58, no ST/T abnormalities, first degree AV block  Recent Labs: 08/12/2019: BUN 10; Creatinine, Ser 0.93; Hemoglobin 13.6; Platelets 349; Potassium 3.7; Sodium 139; TSH 2.523  Recent Lipid Panel No results found for: CHOL, TRIG, HDL, CHOLHDL, VLDL, LDLCALC, LDLDIRECT  Physical Exam:    VS:  BP (!) 104/58   Temp (!) 97.5 F (36.4 C) (Tympanic)   Ht 5\' 5"  (1.651 m)   Wt 159 lb 9.6 oz (72.4 kg)   BMI 26.56 kg/m     Wt Readings from Last 3 Encounters:  01/17/20 159 lb 9.6 oz (72.4 kg)  08/18/19 155 lb 12.8 oz (70.7 kg)     GEN:  Well nourished, well developed in no acute distress HEENT: Normal NECK: No JVD; No carotid bruits LYMPHATICS: No lymphadenopathy CARDIAC: RRR, no murmurs, rubs, gallops RESPIRATORY:  Clear to auscultation without rales, wheezing or rhonchi  ABDOMEN: Soft, non-tender, non-distended MUSCULOSKELETAL:  No edema; No deformity  SKIN: Warm and dry NEUROLOGIC:  Alert and oriented x 3 PSYCHIATRIC:  Normal affect   ASSESSMENT:    1. SVT (supraventricular tachycardia) (HCC)   2. Palpitations    PLAN:    Palpitations/SVT:  Recent ED visit with palpitations, with EKG suggestive of junctional tachycardia.  Started on metoprolol 25 mg twice daily.  Echocardiogram on 09/11/2019 showed normal biventricular function, mild MR.  Zio patch x14 days showed 2 episodes of SVT, longest lasting 2 hours.  Metoprolol subsequently increased to 50 mg twice daily, but reports difficulty tolerating, has only been taking once daily -Will switch to Toprol-XL 50 mg daily.  If unable to tolerate metoprolol or continuing to have episodes of SVT, will plan referral to EP for evaluation of  ablation  RTC in 3 months  Medication Adjustments/Labs and Tests Ordered: Current medicines are reviewed at length with the patient today.  Concerns regarding medicines are outlined above.  Orders Placed This Encounter  Procedures  . EKG 12-Lead   Meds ordered this encounter  Medications  . metoprolol succinate (TOPROL-XL) 50 MG 24 hr tablet    Sig: Take 1 tablet (50 mg total) by mouth daily. Take with or immediately following a meal.    Dispense:  90 tablet    Refill:  3    Patient Instructions  Medication Instructions:  STOP Lopressor START metoprolol succinate (Toprol XL) 50 mg once daily  *If you need a refill on your cardiac medications before your next appointment, please call your pharmacy*  Follow-Up: At [facility], you and your health needs are our priority.  As part of our continuing mission to provide you with exceptional heart care, we have created designated Provider Care Teams.  These Care Teams include your primary Cardiologist (physician) and Advanced Practice Providers (APPs -  Physician Assistants and Nurse Practitioners) who all work together to provide you with the care you need, when you need it.  We recommend signing up for the patient portal called "MyChart".  Sign up information is provided on this After Visit Summary.  MyChart is used to connect with patients for Virtual Visits (Telemedicine).  Patients are able to view lab/test results, encounter notes, upcoming appointments, etc.  Non-urgent messages can be sent to your provider as well.   To learn more about what you can do with MyChart, go to [email or web address].    Your next appointment:   3 month(s)  The format for your next appointment:   In Person  Provider:   Epifanio Lesches, MD       Signed, Little Ishikawa, MD  01/17/2020 11:37 AM     [facility]

## 2020-01-17 ENCOUNTER — Encounter: Payer: Self-pay | Admitting: Cardiology

## 2020-01-17 ENCOUNTER — Ambulatory Visit (INDEPENDENT_AMBULATORY_CARE_PROVIDER_SITE_OTHER): Payer: Commercial Managed Care - PPO | Admitting: Cardiology

## 2020-01-17 ENCOUNTER — Other Ambulatory Visit: Payer: Self-pay

## 2020-01-17 VITALS — BP 104/58 | Temp 97.5°F | Ht 65.0 in | Wt 159.6 lb

## 2020-01-17 DIAGNOSIS — I471 Supraventricular tachycardia: Secondary | ICD-10-CM | POA: Diagnosis not present

## 2020-01-17 DIAGNOSIS — R002 Palpitations: Secondary | ICD-10-CM

## 2020-01-17 MED ORDER — METOPROLOL SUCCINATE ER 50 MG PO TB24: 50.0000 mg | ORAL_TABLET | Freq: Every day | ORAL | 3 refills | Status: DC

## 2020-01-17 NOTE — Patient Instructions (Signed)
Medication Instructions:  STOP Lopressor START metoprolol succinate (Toprol XL) 50 mg once daily  *If you need a refill on your cardiac medications before your next appointment, please call your pharmacy*  Follow-Up: At [facility] you and your health needs are our priority.  As part of our continuing mission to provide you with exceptional heart care, we have created designated Provider Care Teams.  These Care Teams include your primary Cardiologist (physician) and Advanced Practice Providers (APPs -  Physician Assistants and Nurse Practitioners) who all work together to provide you with the care you need, when you need it.  We recommend signing up for the patient portal called "MyChart".  Sign up information is provided on this After Visit Summary.  MyChart is used to connect with patients for Virtual Visits (Telemedicine).  Patients are able to view lab/test results, encounter notes, upcoming appointments, etc.  Non-urgent messages can be sent to your provider as well.   To learn more about what you can do with MyChart, go to [email or web address].    Your next appointment:   3 month(s)  The format for your next appointment:   In Person  Provider:   Epifanio Lesches, MD

## 2020-03-01 DIAGNOSIS — I471 Supraventricular tachycardia: Secondary | ICD-10-CM

## 2020-03-18 ENCOUNTER — Encounter: Payer: Self-pay | Admitting: Internal Medicine

## 2020-03-18 ENCOUNTER — Ambulatory Visit: Payer: Commercial Managed Care - PPO | Admitting: Internal Medicine

## 2020-03-18 ENCOUNTER — Other Ambulatory Visit: Payer: Self-pay

## 2020-03-18 VITALS — BP 120/76 | HR 65 | Ht 65.0 in | Wt 159.4 lb

## 2020-03-18 DIAGNOSIS — I471 Supraventricular tachycardia: Secondary | ICD-10-CM | POA: Diagnosis not present

## 2020-03-18 NOTE — Progress Notes (Signed)
HPI Heather Wright is referred today by Dr. Gaynelle Arabian for evaluation of SVT. She is a pleasant 44 yo woman who notes a h/o heart racing for about 8 years associated with palpitations. She has not experienced syncope. She notes that her episodes start and stop suddenly. She can terminate them with vagal maneuvers. She has tried beta blockers but stopped them after a few months. Allergies  Allergen Reactions  . Bupropion Hives  . Sulfa Antibiotics Hives    Other reaction(s): HIVES Other reaction(s): HIVES   . Amoxicillin Rash    Other reaction(s): RASH Other reaction(s): RASH      Current Outpatient Medications  Medication Sig Dispense Refill  . cyclobenzaprine (FLEXERIL) 10 MG tablet Take 1 tablet by mouth as needed.    . metoprolol succinate (TOPROL-XL) 50 MG 24 hr tablet Take 1 tablet (50 mg total) by mouth daily. Take with or immediately following a meal. 90 tablet 3  . valACYclovir (VALTREX) 500 MG tablet Take 500 mg by mouth daily.     No current facility-administered medications for this visit.     History reviewed. No pertinent past medical history.  ROS:   All systems reviewed and negative except as noted in the HPI.   History reviewed. No pertinent surgical history.   History reviewed. No pertinent family history.   Social History   Socioeconomic History  . Marital status: Single    Spouse name: Not on file  . Number of children: Not on file  . Years of education: Not on file  . Highest education level: Not on file  Occupational History  . Not on file  Tobacco Use  . Smoking status: Never Smoker  . Smokeless tobacco: Never Used  Substance and Sexual Activity  . Alcohol use: Not on file  . Drug use: Not on file  . Sexual activity: Not on file  Other Topics Concern  . Not on file  Social History Narrative  . Not on file   Social Determinants of Health   Financial Resource Strain:   . Difficulty of Paying Living Expenses: Not on file  Food  Insecurity:   . Worried About Programme researcher, broadcasting/film/video in the Last Year: Not on file  . Ran Out of Food in the Last Year: Not on file  Transportation Needs:   . Lack of Transportation (Medical): Not on file  . Lack of Transportation (Non-Medical): Not on file  Physical Activity:   . Days of Exercise per Week: Not on file  . Minutes of Exercise per Session: Not on file  Stress:   . Feeling of Stress : Not on file  Social Connections:   . Frequency of Communication with Friends and Family: Not on file  . Frequency of Social Gatherings with Friends and Family: Not on file  . Attends Religious Services: Not on file  . Active Member of Clubs or Organizations: Not on file  . Attends Banker Meetings: Not on file  . Marital Status: Not on file  Intimate Partner Violence:   . Fear of Current or Ex-Partner: Not on file  . Emotionally Abused: Not on file  . Physically Abused: Not on file  . Sexually Abused: Not on file     BP 120/76   Pulse 65   Ht 5\' 5"  (1.651 m)   Wt 159 lb 6.4 oz (72.3 kg)   LMP 02/21/2020   SpO2 98%   BMI 26.53 kg/m   Physical Exam:  Well appearing NAD HEENT: Unremarkable Neck:  6 cm JVD, no thyromegally Lymphatics:  No adenopathy Back:  No CVA tenderness Lungs:  Clear with no wheezes HEART:  Regular rate rhythm, no murmurs, no rubs, no clicks Abd:  soft, positive bowel sounds, no organomegally, no rebound, no guarding Ext:  2 plus pulses, no edema, no cyanosis, no clubbing Skin:  No rashes no nodules Neuro:  CN II through XII intact, motor grossly intact  EKG NSR   Assess/Plan: 1. SVT - I have reviewed the treatment options with the patient. She has a long short RP tachycardia with a negative P wave in the inferiorly leads with no evidence of pre-excitation. 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 I have discussed the indications/risks/benefits/goals/expectations of EP study and catheter ablation and she will call us if she wishes  to proceed.  Sharlot Gowda Cas Tracz,MD

## 2020-03-18 NOTE — H&P (View-Only) (Signed)
    HPI Heather Wright is referred today by Dr. Shuman for evaluation of SVT. She is a pleasant 44 yo woman who notes a h/o heart racing for about 8 years associated with palpitations. She has not experienced syncope. She notes that her episodes start and stop suddenly. She can terminate them with vagal maneuvers. She has tried beta blockers but stopped them after a few months. Allergies  Allergen Reactions  . Bupropion Hives  . Sulfa Antibiotics Hives    Other reaction(s): HIVES Other reaction(s): HIVES   . Amoxicillin Rash    Other reaction(s): RASH Other reaction(s): RASH      Current Outpatient Medications  Medication Sig Dispense Refill  . cyclobenzaprine (FLEXERIL) 10 MG tablet Take 1 tablet by mouth as needed.    . metoprolol succinate (TOPROL-XL) 50 MG 24 hr tablet Take 1 tablet (50 mg total) by mouth daily. Take with or immediately following a meal. 90 tablet 3  . valACYclovir (VALTREX) 500 MG tablet Take 500 mg by mouth daily.     No current facility-administered medications for this visit.     History reviewed. No pertinent past medical history.  ROS:   All systems reviewed and negative except as noted in the HPI.   History reviewed. No pertinent surgical history.   History reviewed. No pertinent family history.   Social History   Socioeconomic History  . Marital status: Single    Spouse name: Not on file  . Number of children: Not on file  . Years of education: Not on file  . Highest education level: Not on file  Occupational History  . Not on file  Tobacco Use  . Smoking status: Never Smoker  . Smokeless tobacco: Never Used  Substance and Sexual Activity  . Alcohol use: Not on file  . Drug use: Not on file  . Sexual activity: Not on file  Other Topics Concern  . Not on file  Social History Narrative  . Not on file   Social Determinants of Health   Financial Resource Strain:   . Difficulty of Paying Living Expenses: Not on file  Food  Insecurity:   . Worried About Running Out of Food in the Last Year: Not on file  . Ran Out of Food in the Last Year: Not on file  Transportation Needs:   . Lack of Transportation (Medical): Not on file  . Lack of Transportation (Non-Medical): Not on file  Physical Activity:   . Days of Exercise per Week: Not on file  . Minutes of Exercise per Session: Not on file  Stress:   . Feeling of Stress : Not on file  Social Connections:   . Frequency of Communication with Friends and Family: Not on file  . Frequency of Social Gatherings with Friends and Family: Not on file  . Attends Religious Services: Not on file  . Active Member of Clubs or Organizations: Not on file  . Attends Club or Organization Meetings: Not on file  . Marital Status: Not on file  Intimate Partner Violence:   . Fear of Current or Ex-Partner: Not on file  . Emotionally Abused: Not on file  . Physically Abused: Not on file  . Sexually Abused: Not on file     BP 120/76   Pulse 65   Ht 5' 5" (1.651 m)   Wt 159 lb 6.4 oz (72.3 kg)   LMP 02/21/2020   SpO2 98%   BMI 26.53 kg/m   Physical Exam:    Well appearing NAD HEENT: Unremarkable Neck:  6 cm JVD, no thyromegally Lymphatics:  No adenopathy Back:  No CVA tenderness Lungs:  Clear with no wheezes HEART:  Regular rate rhythm, no murmurs, no rubs, no clicks Abd:  soft, positive bowel sounds, no organomegally, no rebound, no guarding Ext:  2 plus pulses, no edema, no cyanosis, no clubbing Skin:  No rashes no nodules Neuro:  CN II through XII intact, motor grossly intact  EKG NSR   Assess/Plan: 1. SVT - I have reviewed the treatment options with the patient. She has a long short RP tachycardia with a negative P wave in the inferiorly leads with no evidence of pre-excitation. 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 I have discussed the indications/risks/benefits/goals/expectations of EP study and catheter ablation and she will call us if she wishes  to proceed.  Sharlot Gowda Javonne Dorko,MD

## 2020-03-18 NOTE — Patient Instructions (Addendum)
Medication Instructions:  Your physician recommends that you continue on your current medications as directed. Please refer to the Current Medication list given to you today.  Labwork: None ordered.  Testing/Procedures: None ordered.  Follow-Up:  The following dates are available for procedures:  November 18, 22 December 2, 13, 16, 27, 30  Any Other Special Instructions Will Be Listed Below (If Applicable).  If you need a refill on your cardiac medications before your next appointment, please call your pharmacy.    Cardiac Ablation Cardiac ablation is a procedure to disable (ablate) a small amount of heart tissue in very specific places. The heart has many electrical connections. Sometimes these connections are abnormal and can cause the heart to beat very fast or irregularly. Ablating some of the problem areas can improve the heart rhythm or return it to normal. Ablation may be done for people who:  Have Wolff-Parkinson-White syndrome.  Have fast heart rhythms (tachycardia).  Have taken medicines for an abnormal heart rhythm (arrhythmia) that were not effective or caused side effects.  Have a high-risk heartbeat that may be life-threatening. During the procedure, a small incision is made in the neck or the groin, and a long, thin, flexible tube (catheter) is inserted into the incision and moved to the heart. Small devices (electrodes) on the tip of the catheter will send out electrical currents. A type of X-ray (fluoroscopy) will be used to help guide the catheter and to provide images of the heart. Tell a health care provider about:  Any allergies you have.  All medicines you are taking, including vitamins, herbs, eye drops, creams, and over-the-counter medicines.  Any problems you or family members have had with anesthetic medicines.  Any blood disorders you have.  Any surgeries you have had.  Any medical conditions you have, such as kidney failure.  Whether you are  pregnant or may be pregnant. What are the risks? Generally, this is a safe procedure. However, problems may occur, including:  Infection.  Bruising and bleeding at the catheter insertion site.  Bleeding into the chest, especially into the sac that surrounds the heart. This is a serious complication.  Stroke or blood clots.  Damage to other structures or organs.  Allergic reaction to medicines or dyes.  Need for a permanent pacemaker if the normal electrical system is damaged. A pacemaker is a small computer that sends electrical signals to the heart and helps your heart beat normally.  The procedure not being fully effective. This may not be recognized until months later. Repeat ablation procedures are sometimes required. What happens before the procedure?  Follow instructions from your health care provider about eating or drinking restrictions.  Ask your health care provider about: ? Changing or stopping your regular medicines. This is especially important if you are taking diabetes medicines or blood thinners. ? Taking medicines such as aspirin and ibuprofen. These medicines can thin your blood. Do not take these medicines before your procedure if your health care provider instructs you not to.  Plan to have someone take you home from the hospital or clinic.  If you will be going home right after the procedure, plan to have someone with you for 24 hours. What happens during the procedure?  To lower your risk of infection: ? Your health care team will wash or sanitize their hands. ? Your skin will be washed with soap. ? Hair may be removed from the incision area.  An IV tube will be inserted into one of your veins.  You will be given a medicine to help you relax (sedative).  The skin on your neck or groin will be numbed.  An incision will be made in your neck or your groin.  A needle will be inserted through the incision and into a large vein in your neck or groin.  A  catheter will be inserted into the needle and moved to your heart.  Dye may be injected through the catheter to help your surgeon see the area of the heart that needs treatment.  Electrical currents will be sent from the catheter to ablate heart tissue in desired areas. There are three types of energy that may be used to ablate heart tissue: ? Heat (radiofrequency energy). ? Laser energy. ? Extreme cold (cryoablation).  When the necessary tissue has been ablated, the catheter will be removed.  Pressure will be held on the catheter insertion area to prevent excessive bleeding.  A bandage (dressing) will be placed over the catheter insertion area. The procedure may vary among health care providers and hospitals. What happens after the procedure?  Your blood pressure, heart rate, breathing rate, and blood oxygen level will be monitored until the medicines you were given have worn off.  Your catheter insertion area will be monitored for bleeding. You will need to lie still for a few hours to ensure that you do not bleed from the catheter insertion area.  Do not drive for 24 hours or as long as directed by your health care provider. Summary  Cardiac ablation is a procedure to disable (ablate) a small amount of heart tissue in very specific places. Ablating some of the problem areas can improve the heart rhythm or return it to normal.  During the procedure, electrical currents will be sent from the catheter to ablate heart tissue in desired areas. This information is not intended to replace advice given to you by your health care provider. Make sure you discuss any questions you have with your health care provider. Document Revised: 10/25/2017 Document Reviewed: 03/23/2016 Elsevier Patient Education  2020 [employer].

## 2020-03-20 ENCOUNTER — Telehealth: Payer: Self-pay

## 2020-03-20 DIAGNOSIS — I471 Supraventricular tachycardia: Secondary | ICD-10-CM

## 2020-03-20 NOTE — Telephone Encounter (Signed)
Pt scheduled for SVT ablation on November 18  Labs/covid test scheduled for November 16  Work up complete.

## 2020-04-02 ENCOUNTER — Other Ambulatory Visit: Payer: Self-pay

## 2020-04-02 ENCOUNTER — Other Ambulatory Visit (HOSPITAL_COMMUNITY)
Admission: RE | Admit: 2020-04-02 | Discharge: 2020-04-02 | Disposition: A | Discharge status: Home / Self Care | Payer: Commercial Managed Care - PPO | Source: Ambulatory Visit | Attending: Internal Medicine | Admitting: Internal Medicine

## 2020-04-02 ENCOUNTER — Other Ambulatory Visit: Payer: Commercial Managed Care - PPO

## 2020-04-02 DIAGNOSIS — I471 Supraventricular tachycardia: Secondary | ICD-10-CM | POA: Diagnosis not present

## 2020-04-02 DIAGNOSIS — Z20822 Contact with and (suspected) exposure to covid-19: Secondary | ICD-10-CM | POA: Insufficient documentation

## 2020-04-02 DIAGNOSIS — Z01812 Encounter for preprocedural laboratory examination: Secondary | ICD-10-CM | POA: Diagnosis not present

## 2020-04-02 LAB — CBC WITH DIFFERENTIAL/PLATELET
Basophils Absolute: 0 10*3/uL (ref 0.0–0.2)
Basos: 0 %
EOS (ABSOLUTE): 0.2 10*3/uL (ref 0.0–0.4)
Eos: 2 %
Hematocrit: 39.6 % (ref 34.0–46.6)
Hemoglobin: 13.4 g/dL (ref 11.1–15.9)
Lymphocytes Absolute: 2.1 10*3/uL (ref 0.7–3.1)
Lymphs: 29 %
MCH: 30.4 pg (ref 26.6–33.0)
MCHC: 33.8 g/dL (ref 31.5–35.7)
MCV: 90 fL (ref 79–97)
Monocytes Absolute: 0.6 10*3/uL (ref 0.1–0.9)
Monocytes: 8 %
Neutrophils Absolute: 4.6 10*3/uL (ref 1.4–7.0)
Neutrophils: 61 %
Platelets: 437 10*3/uL (ref 150–450)
RBC: 4.41 x10E6/uL (ref 3.77–5.28)
RDW: 13.9 % (ref 11.7–15.4)
WBC: 7.5 10*3/uL (ref 3.4–10.8)

## 2020-04-02 LAB — BASIC METABOLIC PANEL
BUN/Creatinine Ratio: 11 (ref 9–23)
BUN: 9 mg/dL (ref 6–24)
CO2: 23 mmol/L (ref 20–29)
Calcium: 9.6 mg/dL (ref 8.7–10.2)
Chloride: 105 mmol/L (ref 96–106)
Creatinine, Ser: 0.81 mg/dL (ref 0.57–1.00)
GFR calc Af Amer: 103 mL/min/{1.73_m2} (ref >59)
GFR calc non Af Amer: 89 mL/min/{1.73_m2} (ref >59)
Glucose: 92 mg/dL (ref 65–99)
Potassium: 4.1 mmol/L (ref 3.5–5.2)
Sodium: 138 mmol/L (ref 134–144)

## 2020-04-02 LAB — SARS CORONAVIRUS 2 (TAT 6-24 HRS): SARS Coronavirus 2: NEGATIVE

## 2020-04-03 NOTE — Progress Notes (Signed)
Instructed patient on the following items: °Arrival time 0530 °Nothing to eat or drink after midnight °No meds AM of procedure °Responsible person to drive you home and stay with you for 24 hrs ° ° °   °

## 2020-04-04 ENCOUNTER — Ambulatory Visit (HOSPITAL_COMMUNITY)
Admission: RE | Disposition: A | Discharge status: Home / Self Care | Payer: Self-pay | Source: Home / Self Care | Attending: Internal Medicine

## 2020-04-04 ENCOUNTER — Ambulatory Visit (HOSPITAL_COMMUNITY)
Admission: RE | Admit: 2020-04-04 | Discharge: 2020-04-04 | Disposition: A | Discharge status: Home / Self Care | Payer: Commercial Managed Care - PPO | Attending: Internal Medicine | Admitting: Internal Medicine

## 2020-04-04 ENCOUNTER — Other Ambulatory Visit: Payer: Self-pay

## 2020-04-04 DIAGNOSIS — I471 Supraventricular tachycardia: Secondary | ICD-10-CM | POA: Insufficient documentation

## 2020-04-04 DIAGNOSIS — Z882 Allergy status to sulfonamides status: Secondary | ICD-10-CM | POA: Insufficient documentation

## 2020-04-04 DIAGNOSIS — Z79899 Other long term (current) drug therapy: Secondary | ICD-10-CM | POA: Insufficient documentation

## 2020-04-04 DIAGNOSIS — Z880 Allergy status to penicillin: Secondary | ICD-10-CM | POA: Insufficient documentation

## 2020-04-04 HISTORY — PX: SVT ABLATION: EP1225

## 2020-04-04 LAB — PREGNANCY, URINE: Preg Test, Ur: NEGATIVE

## 2020-04-04 SURGERY — SVT ABLATION

## 2020-04-04 MED ORDER — FENTANYL CITRATE (PF) 100 MCG/2ML IJ SOLN
INTRAMUSCULAR | Status: DC | PRN
  Administered 2020-04-04 (×4): 12.5 ug via INTRAVENOUS
  Administered 2020-04-04 (×2): 25 ug via INTRAVENOUS
  Administered 2020-04-04 (×2): 12.5 ug via INTRAVENOUS

## 2020-04-04 MED ORDER — MIDAZOLAM HCL 5 MG/5ML IJ SOLN
INTRAMUSCULAR | Status: AC
  Filled 2020-04-04: qty 5

## 2020-04-04 MED ORDER — SODIUM CHLORIDE 0.9% FLUSH: 3.0000 mL | Freq: Two times a day (BID) | INTRAVENOUS | Status: DC

## 2020-04-04 MED ORDER — ISOPROTERENOL HCL 0.2 MG/ML IJ SOLN
INTRAMUSCULAR | Status: AC
  Filled 2020-04-04: qty 5

## 2020-04-04 MED ORDER — BUPIVACAINE HCL (PF) 0.25 % IJ SOLN
INTRAMUSCULAR | Status: DC | PRN
  Administered 2020-04-04: 60 mL

## 2020-04-04 MED ORDER — BUPIVACAINE HCL (PF) 0.25 % IJ SOLN
INTRAMUSCULAR | Status: AC
  Filled 2020-04-04: qty 60

## 2020-04-04 MED ORDER — HEPARIN (PORCINE) IN NACL 1000-0.9 UT/500ML-% IV SOLN
INTRAVENOUS | Status: DC | PRN
  Administered 2020-04-04: 500 mL

## 2020-04-04 MED ORDER — MIDAZOLAM HCL 5 MG/5ML IJ SOLN
INTRAMUSCULAR | Status: DC | PRN
  Administered 2020-04-04 (×10): 1 mg via INTRAVENOUS

## 2020-04-04 MED ORDER — FENTANYL CITRATE (PF) 100 MCG/2ML IJ SOLN
INTRAMUSCULAR | Status: AC
  Filled 2020-04-04: qty 2

## 2020-04-04 MED ORDER — ISOPROTERENOL HCL 0.2 MG/ML IJ SOLN
INTRAVENOUS | Status: DC | PRN
  Administered 2020-04-04: 1 ug/min via INTRAVENOUS

## 2020-04-04 MED ORDER — SODIUM CHLORIDE 0.9 % IV SOLN: INTRAVENOUS | Status: DC

## 2020-04-04 SURGICAL SUPPLY — 13 items
BAG SNAP BAND KOVER 36X36 (MISCELLANEOUS) ×3 IMPLANT
CATH EZ STEER NAV 4MM D-F CUR (ABLATOR) ×3 IMPLANT
CATH JOSEPH QUAD ALLRED 6F REP (CATHETERS) ×6 IMPLANT
CATH POLARIS X 2.5/5/2.5 DECAP (CATHETERS) ×3 IMPLANT
COVER DOME SNAP 22 D (MISCELLANEOUS) ×3 IMPLANT
PACK EP LATEX FREE (CUSTOM PROCEDURE TRAY) ×3
PACK EP LF (CUSTOM PROCEDURE TRAY) ×1 IMPLANT
PAD PRO RADIOLUCENT 2001M-C (PAD) ×3 IMPLANT
PATCH CARTO3 (PAD) ×3 IMPLANT
SHEATH PINNACLE 6F 10CM (SHEATH) ×6 IMPLANT
SHEATH PINNACLE 7F 10CM (SHEATH) ×3 IMPLANT
SHEATH PINNACLE 8F 10CM (SHEATH) ×3 IMPLANT
SHIELD RADPAD SCOOP 12X17 (MISCELLANEOUS) ×3 IMPLANT

## 2020-04-04 NOTE — Progress Notes (Signed)
Site area: rt groin venous site Site Prior to Removal:  Level 0 Pressure Applied For: 15 minutes Manual:   yes Patient Status During Pull:  stable Post Pull Site:  Level 0 Post Pull Instructions Given:  yes Post Pull Pulses Present: rt dp palpable Dressing Applied:  Gauze and tegaderm Bedrest begins @ 1030 Comments: IV saline locked

## 2020-04-04 NOTE — Progress Notes (Signed)
Up and walked and tolerated well; right groin stable no bleeding or hematoma 

## 2020-04-04 NOTE — Progress Notes (Signed)
Dr Ladona Ridgel here to see pt states ok to d/c home at 1700

## 2020-04-04 NOTE — Interval H&P Note (Signed)
History and Physical Interval Note:  04/04/2020 10:14 PM  Heather Wright  has presented today for surgery, with the diagnosis of svt.  The various methods of treatment have been discussed with the patient and family. After consideration of risks, benefits and other options for treatment, the patient has consented to  Procedure(s): SVT ABLATION (N/A) as a surgical intervention.  The patient's history has been reviewed, patient examined, no change in status, stable for surgery.  I have reviewed the patient's chart and labs.  Questions were answered to the patient's satisfaction.     Lewayne Bunting

## 2020-04-04 NOTE — Progress Notes (Signed)
Site area: rt ij Site Prior to Removal:  Level 0 Pressure Applied For: 10 minutes Manual:   yes Patient Status During Pull:  stable Post Pull Site:  Level 0 Post Pull Instructions Given:  yes Post Pull Pulses Present: NA Dressing Applied:  Petroleum gauze, gauze then tegaderm Bedrest begins @  Comments:

## 2020-04-04 NOTE — Discharge Instructions (Signed)

## 2020-04-04 NOTE — Progress Notes (Signed)
Discharge instructions reviewed with pt and her sig other Minerva Areola  (via telephone) both voice understanding.

## 2020-04-05 ENCOUNTER — Encounter (HOSPITAL_COMMUNITY): Payer: Self-pay | Admitting: Internal Medicine

## 2020-04-05 MED FILL — Midazolam HCl Inj 5 MG/5ML (Base Equivalent): INTRAMUSCULAR | Qty: 5 | Status: AC

## 2020-04-05 MED FILL — Bupivacaine HCl Preservative Free (PF) Inj 0.25%: INTRAMUSCULAR | Qty: 30 | Status: AC

## 2020-04-08 MED ORDER — METOPROLOL SUCCINATE ER 50 MG PO TB24: 50.0000 mg | ORAL_TABLET | Freq: Every day | ORAL | 3 refills | Status: DC

## 2020-04-14 NOTE — Progress Notes (Deleted)
Cardiology Office Note:    Date:  04/14/2020   ID:  Heather Wright, DOB August 17, 1975, MRN [id number]  PCP:  Verlon Au, MD  Cardiologist:  Little Ishikawa, MD  Electrophysiologist:  None   Referring MD: Verlon Au, MD   No chief complaint on file.   History of Present Illness:    Heather Wright is a 44 y.o. female with a hx of SVT who presents for follow-up.  She was initially seen on 08/18/2019.  Presented on 08/12/19 to the ED with palpitations.  She had been on metoprolol in the past for SVT, but had been off it for the past 2 years.  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  She was given IV metoprolol and discharged on p.o. metoprolol.  She reports that she was diagnosed with SVT around 2015.  She felt like her heart was pounding and went to the ED.  Episode lasted for about an hour.  Reports that she had multiple episodes of this after her diagnosis, but had not had an episode for the last 2 years.  On 3/27 she woke up at 1:30 AM and felt like she had been running.  She tried coughing and bearing down, but no change in her symptoms.  Reports heart rate was going from 90s to 110s.  She went to the ED, where she was given IV metoprolol and then discharged on p.o. metoprolol.  States that episode broke shortly after discharge from the ED. Since starting metoprolol 25 mg twice daily, she has not had further episodes.  No alcohol or smoking.  Rare caffeine intake.  Echocardiogram on 09/11/2019 showed normal biventricular function, mild MR.  Zio patch x14 days showed 2 episodes of SVT, longest lasting 2 hours.  She was referred to EP and underwent successful SVT ablation on 04/04/2020.  Since last clinic visit,   she reports that since she increased her metoprolol to 50 mg twice daily, she has had one episode of SVT.  Occurred yesterday morning around 4 AM, states that it broke using vagal maneuvers.  She has not been taking her Lopressor dose in the evening, as  states that it causes her to feel pressure in her head.  Has only been taking her morning dose.  She denies any lightheadedness, syncope, chest pain, or dyspnea.   No past medical history on file.    Current Medications: No outpatient medications have been marked as taking for the 04/17/20 encounter (Appointment) with Little Ishikawa, MD.     Allergies:   Bupropion, Sulfa antibiotics, and Amoxicillin   Social History   Socioeconomic History  . Marital status: Single    Spouse name: Not on file  . Number of children: Not on file  . Years of education: Not on file  . Highest education level: Not on file  Occupational History  . Not on file  Tobacco Use  . Smoking status: Never Smoker  . Smokeless tobacco: Never Used  Substance and Sexual Activity  . Alcohol use: Not on file  . Drug use: Not on file  . Sexual activity: Not on file  Other Topics Concern  . Not on file  Social History Narrative  . Not on file   Social Determinants of Health   Financial Resource Strain:   . Difficulty of Paying Living Expenses: Not on file  Food Insecurity:   . Worried About Programme researcher, broadcasting/film/video in the Last Year: Not on file  . Ran Out  of Food in the Last Year: Not on file  Transportation Needs:   . Lack of Transportation (Medical): Not on file  . Lack of Transportation (Non-Medical): Not on file  Physical Activity:   . Days of Exercise per Week: Not on file  . Minutes of Exercise per Session: Not on file  Stress:   . Feeling of Stress : Not on file  Social Connections:   . Frequency of Communication with Friends and Family: Not on file  . Frequency of Social Gatherings with Friends and Family: Not on file  . Attends Religious Services: Not on file  . Active Member of Clubs or Organizations: Not on file  . Attends Banker Meetings: Not on file  . Marital Status: Not on file     Family History: The patient's family history is not on file.  ROS:   Please see  the history of present illness.     All other systems reviewed and are negative.  EKGs/Labs/Other Studies Reviewed:    The following studies were reviewed today:   EKG:  EKG is ordered today.  The ekg ordered today demonstrates normal sinus rhythm, rate 58, no ST/T abnormalities, first degree AV block  Recent Labs: 08/12/2019: TSH 2.523 04/02/2020: BUN 9; Creatinine, Ser 0.81; Hemoglobin 13.4; Platelets 437; Potassium 4.1; Sodium 138  Recent Lipid Panel No results found for: CHOL, TRIG, HDL, CHOLHDL, VLDL, LDLCALC, LDLDIRECT  Physical Exam:    VS:  There were no vitals taken for this visit.    Wt Readings from Last 3 Encounters:  04/04/20 156 lb (70.8 kg)  03/18/20 159 lb 6.4 oz (72.3 kg)  01/17/20 159 lb 9.6 oz (72.4 kg)     GEN:  Well nourished, well developed in no acute distress HEENT: Normal NECK: No JVD; No carotid bruits LYMPHATICS: No lymphadenopathy CARDIAC: RRR, no murmurs, rubs, gallops RESPIRATORY:  Clear to auscultation without rales, wheezing or rhonchi  ABDOMEN: Soft, non-tender, non-distended MUSCULOSKELETAL:  No edema; No deformity  SKIN: Warm and dry NEUROLOGIC:  Alert and oriented x 3 PSYCHIATRIC:  Normal affect   ASSESSMENT:    No diagnosis found. PLAN:    Palpitations/SVT:  Had ED visit with palpitations, with EKG suggestive of junctional tachycardia.  Started on metoprolol 25 mg twice daily.  Echocardiogram on 09/11/2019 showed normal biventricular function, mild MR.  Zio patch x14 days showed 2 episodes of SVT, longest lasting 2 hours.  Metoprolol subsequently increased to 50 mg twice daily, but reports difficulty tolerating, has only been taking once daily.  She was referred to EP and underwent successful SVT ablation on 11/18/202  RTC in ***  Medication Adjustments/Labs and Tests Ordered: Current medicines are reviewed at length with the patient today.  Concerns regarding medicines are outlined above.  No orders of the defined types were placed  in this encounter.  No orders of the defined types were placed in this encounter.   There are no Patient Instructions on file for this visit.   Signed, Little Ishikawa, MD  04/14/2020 3:44 PM    [facility]

## 2020-04-17 ENCOUNTER — Ambulatory Visit: Payer: Commercial Managed Care - PPO | Admitting: Cardiology

## 2020-05-03 ENCOUNTER — Ambulatory Visit: Payer: Commercial Managed Care - PPO | Admitting: Internal Medicine

## 2020-05-14 ENCOUNTER — Encounter: Payer: Self-pay | Admitting: Internal Medicine

## 2020-05-14 ENCOUNTER — Ambulatory Visit: Payer: Commercial Managed Care - PPO | Admitting: Internal Medicine

## 2020-05-14 ENCOUNTER — Other Ambulatory Visit: Payer: Self-pay

## 2020-05-14 VITALS — BP 114/72 | HR 71 | Ht 65.0 in | Wt 160.6 lb

## 2020-05-14 DIAGNOSIS — I471 Supraventricular tachycardia: Secondary | ICD-10-CM

## 2020-05-14 NOTE — Progress Notes (Signed)
      HPI Heather Wright returns today for followup. 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 IN the interim, she notes minimal palpitations and nothing sustained. She denies chest pain or sob.  Allergies  Allergen Reactions  . Bupropion Hives  . Sulfa Antibiotics Hives       . Amoxicillin Rash          Current Outpatient Medications  Medication Sig Dispense Refill  . acetaminophen (TYLENOL) 500 MG tablet Take 1,000 mg by mouth every 6 (six) hours as needed for mild pain, moderate pain or headache.    . cyclobenzaprine (FLEXERIL) 10 MG tablet Take 10 mg by mouth daily as needed for muscle spasms.     . metoprolol succinate (TOPROL-XL) 50 MG 24 hr tablet Take 1 tablet (50 mg total) by mouth daily. Take with or immediately following a meal. 90 tablet 3  . valACYclovir (VALTREX) 1000 MG tablet Take 500 mg by mouth daily.      No current facility-administered medications for this visit.     History reviewed. No pertinent past medical history.  ROS:   All systems reviewed and negative except as noted in the HPI.   Past Surgical History:  Procedure Laterality Date  . SVT ABLATION N/A 04/04/2020   Procedure: SVT ABLATION;  Surgeon: Marinus Maw, MD;  Location: [facility] INVASIVE CV LAB;  Service: Cardiovascular;  Laterality: N/A;     History reviewed. No pertinent family history.   Social History   Socioeconomic History  . Marital status: Single    Spouse name: Not on file  . Number of children: Not on file  . Years of education: Not on file  . Highest education level: Not on file  Occupational History  . Not on file  Tobacco Use  . Smoking status: Never Smoker  . Smokeless tobacco: Never Used  Substance and Sexual Activity  . Alcohol use: Not on file  . Drug use: Not on file  . Sexual activity: Not on file  Other Topics Concern  . Not on file  Social History Narrative  . Not on file   Social  Determinants of Health   Financial Resource Strain: Not on file  Food Insecurity: Not on file  Transportation Needs: Not on file  Physical Activity: Not on file  Stress: Not on file  Social Connections: Not on file  Intimate Partner Violence: Not on file     BP 114/72   Pulse 71   Ht 5\' 5"  (1.651 m)   Wt 160 lb 9.6 oz (72.8 kg)   SpO2 99%   BMI 26.73 kg/m   Physical Exam:  Well appearing NAD HEENT: Unremarkable Neck:  No JVD, no thyromegally Lymphatics:  No adenopathy Back:  No CVA tenderness Lungs:  Clear with no wheezes HEART:  Regular rate rhythm, no murmurs, no rubs, no clicks Abd:  soft, positive bowel sounds, no organomegally, no rebound, no guarding Ext:  2 plus pulses, no edema, no cyanosis, no clubbing Skin:  No rashes no nodules Neuro:  CN II through XII intact, motor grossly intact  EKG - nsr  Assess/Plan: 1. SVT - she is s/p ablation of AVNRT. She appears to be doing well with no recurrent symptomatic SVT. She will wean off of her beta blocker.  Vicci Reder,MD

## 2020-05-14 NOTE — Patient Instructions (Addendum)
Medication Instructions:  Your physician recommends that you continue on your current medications as directed. Please refer to the Current Medication list given to you today.  Patient to wean off Toprol XL  Labwork: None ordered.  Testing/Procedures: None ordered.  Follow-Up: Your physician wants you to follow-up in: as needed with Dr. Ladona Ridgel.      Any Other Special Instructions Will Be Listed Below (If Applicable).  If you need a refill on your cardiac medications before your next appointment, please call your pharmacy.

## 2020-06-16 NOTE — Progress Notes (Signed)
Cardiology Office Note:    Date:  06/17/2020   ID:  Heather Wright, DOB 13-Dec-1975, MRN [id number]  PCP:  Verlon Au, MD  Cardiologist:  Little Ishikawa, MD  Electrophysiologist:  None   Referring MD: Verlon Au, MD   Chief Complaint  Patient presents with  . Palpitations    History of Present Illness:    Heather Wright is a 45 y.o. female with a hx of SVT who presents for follow-up.  She was initially seen on 08/18/2019.  Presented on 08/12/19 to the ED with palpitations.  She had been on metoprolol in the past for SVT, but had been off it for the past 2 years.  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  She was given IV metoprolol and discharged on p.o. metoprolol.  Symptoms improved with metoprolol, but continued to have episodes of SVT, so was referred to EP and underwent SVT ablation on 04/04/2020.  Echocardiogram on 09/11/2019 showed normal biventricular function, mild MR.  Zio patch x14 days showed 2 episodes of SVT, longest lasting 2 hours.  Since last clinic visit, she reports that she has been doing well.  She has had no sustained palpitations since her SVT ablation.  Does report she has been having brief palpitations that last for about a second, can occur a few times per day.  Feels that this has been driven by stress, as he has been under a lot of stress recently.  Denies any chest pain, dyspnea, lightheadedness, syncope, lower extremity edema.  She works out 4-5 times per week, denies any exertional symptoms.     No past medical history on file.    Current Medications: Current Meds  Medication Sig  . acetaminophen (TYLENOL) 500 MG tablet Take 1,000 mg by mouth every 6 (six) hours as needed for mild pain, moderate pain or headache.  . valACYclovir (VALTREX) 1000 MG tablet Take 500 mg by mouth daily.      Allergies:   Bupropion, Sulfa antibiotics, and Amoxicillin   Social History   Socioeconomic History  . Marital status: Single     Spouse name: Not on file  . Number of children: Not on file  . Years of education: Not on file  . Highest education level: Not on file  Occupational History  . Not on file  Tobacco Use  . Smoking status: Never Smoker  . Smokeless tobacco: Never Used  Substance and Sexual Activity  . Alcohol use: Not on file  . Drug use: Not on file  . Sexual activity: Not on file  Other Topics Concern  . Not on file  Social History Narrative  . Not on file   Social Determinants of Health   Financial Resource Strain: Not on file  Food Insecurity: Not on file  Transportation Needs: Not on file  Physical Activity: Not on file  Stress: Not on file  Social Connections: Not on file     Family History: The patient's family history is not on file.  ROS:   Please see the history of present illness.     All other systems reviewed and are negative.  EKGs/Labs/Other Studies Reviewed:    The following studies were reviewed today:   EKG:  EKG is ordered today.  The ekg ordered today demonstrates normal sinus rhythm, rate 62, no ST/T abnormalities  Recent Labs: 08/12/2019: TSH 2.523 04/02/2020: BUN 9; Creatinine, Ser 0.81; Hemoglobin 13.4; Platelets 437; Potassium 4.1; Sodium 138  Recent Lipid Panel No results found  for: CHOL, TRIG, HDL, CHOLHDL, VLDL, LDLCALC, LDLDIRECT  Physical Exam:    VS:  BP 108/62   Pulse 62   Ht 5\' 5"  (1.651 m)   Wt 155 lb 9.6 oz (70.6 kg)   BMI 25.89 kg/m     Wt Readings from Last 3 Encounters:  06/17/20 155 lb 9.6 oz (70.6 kg)  05/14/20 160 lb 9.6 oz (72.8 kg)  04/04/20 156 lb (70.8 kg)     GEN:  Well nourished, well developed in no acute distress HEENT: Normal NECK: No JVD; No carotid bruits CARDIAC: RRR, no murmurs, rubs, gallops RESPIRATORY:  Clear to auscultation without rales, wheezing or rhonchi  ABDOMEN: Soft, non-tender, non-distended MUSCULOSKELETAL:  No edema; No deformity  SKIN: Warm and dry NEUROLOGIC:  Alert and oriented x 3 PSYCHIATRIC:   Normal affect   ASSESSMENT:    1. SVT (supraventricular tachycardia) (HCC)   2. Palpitations    PLAN:    SVT: Status post ablation 04/04/2020 with Dr. 04/06/2020.  Was weaned off beta-blocker following procedure.  No symptoms since ablation to suggest recurrence.  Palpitations: Description suggest PACs/PVCs.  Discussed that if frequency increases or worsening symptoms, can evaluate with Zio patch.  Will monitor for now  RTC in 1 year   Medication Adjustments/Labs and Tests Ordered: Current medicines are reviewed at length with the patient today.  Concerns regarding medicines are outlined above.  Orders Placed This Encounter  Procedures  . EKG 12-Lead   No orders of the defined types were placed in this encounter.   Patient Instructions  Medication Instructions:  Your physician recommends that you continue on your current medications as directed. Please refer to the Current Medication list given to you today.  *If you need a refill on your cardiac medications before your next appointment, please call your pharmacy*  Follow-Up: At [facility], you and your health needs are our priority.  As part of our continuing mission to provide you with exceptional heart care, we have created designated Provider Care Teams.  These Care Teams include your primary Cardiologist (physician) and Advanced Practice Providers (APPs -  Physician Assistants and Nurse Practitioners) who all work together to provide you with the care you need, when you need it.  We recommend signing up for the patient portal called "MyChart".  Sign up information is provided on this After Visit Summary.  MyChart is used to connect with patients for Virtual Visits (Telemedicine).  Patients are able to view lab/test results, encounter notes, upcoming appointments, etc.  Non-urgent messages can be sent to your provider as well.   To learn more about what you can do with MyChart, go to [facility].    Your next  appointment:   12 month(s)  The format for your next appointment:   In Person  Provider:   [email or web address], MD        Signed, Epifanio Lesches, MD  06/17/2020 8:32 AM    [facility]

## 2020-06-17 ENCOUNTER — Encounter: Payer: Self-pay | Admitting: Cardiology

## 2020-06-17 ENCOUNTER — Ambulatory Visit: Payer: Commercial Managed Care - PPO | Admitting: Cardiology

## 2020-06-17 ENCOUNTER — Other Ambulatory Visit: Payer: Self-pay

## 2020-06-17 VITALS — BP 108/62 | HR 62 | Ht 65.0 in | Wt 155.6 lb

## 2020-06-17 DIAGNOSIS — R002 Palpitations: Secondary | ICD-10-CM

## 2020-06-17 DIAGNOSIS — I471 Supraventricular tachycardia: Secondary | ICD-10-CM | POA: Diagnosis not present

## 2020-06-17 NOTE — Patient Instructions (Signed)

## 2020-06-20 ENCOUNTER — Other Ambulatory Visit: Payer: Self-pay | Admitting: Obstetrics and Gynecology

## 2020-06-20 DIAGNOSIS — R928 Other abnormal and inconclusive findings on diagnostic imaging of breast: Secondary | ICD-10-CM

## 2020-07-08 ENCOUNTER — Other Ambulatory Visit: Payer: Self-pay

## 2020-07-08 ENCOUNTER — Ambulatory Visit
Admission: RE | Admit: 2020-07-08 | Discharge: 2020-07-08 | Disposition: A | Discharge status: Home / Self Care | Payer: Commercial Managed Care - PPO | Source: Ambulatory Visit | Attending: Obstetrics and Gynecology | Admitting: Obstetrics and Gynecology

## 2020-07-08 ENCOUNTER — Ambulatory Visit: Payer: Commercial Managed Care - PPO

## 2020-07-08 DIAGNOSIS — R928 Other abnormal and inconclusive findings on diagnostic imaging of breast: Secondary | ICD-10-CM

## 2021-11-04 IMAGING — DX DG CHEST 2V
2 series · 2 of 2 positions shown · non-contrast
Comparison: Chest radiograph 11/10/2017

CLINICAL DATA: Tachycardia.

EXAM:
CHEST - 2 VIEW

[chest pa]
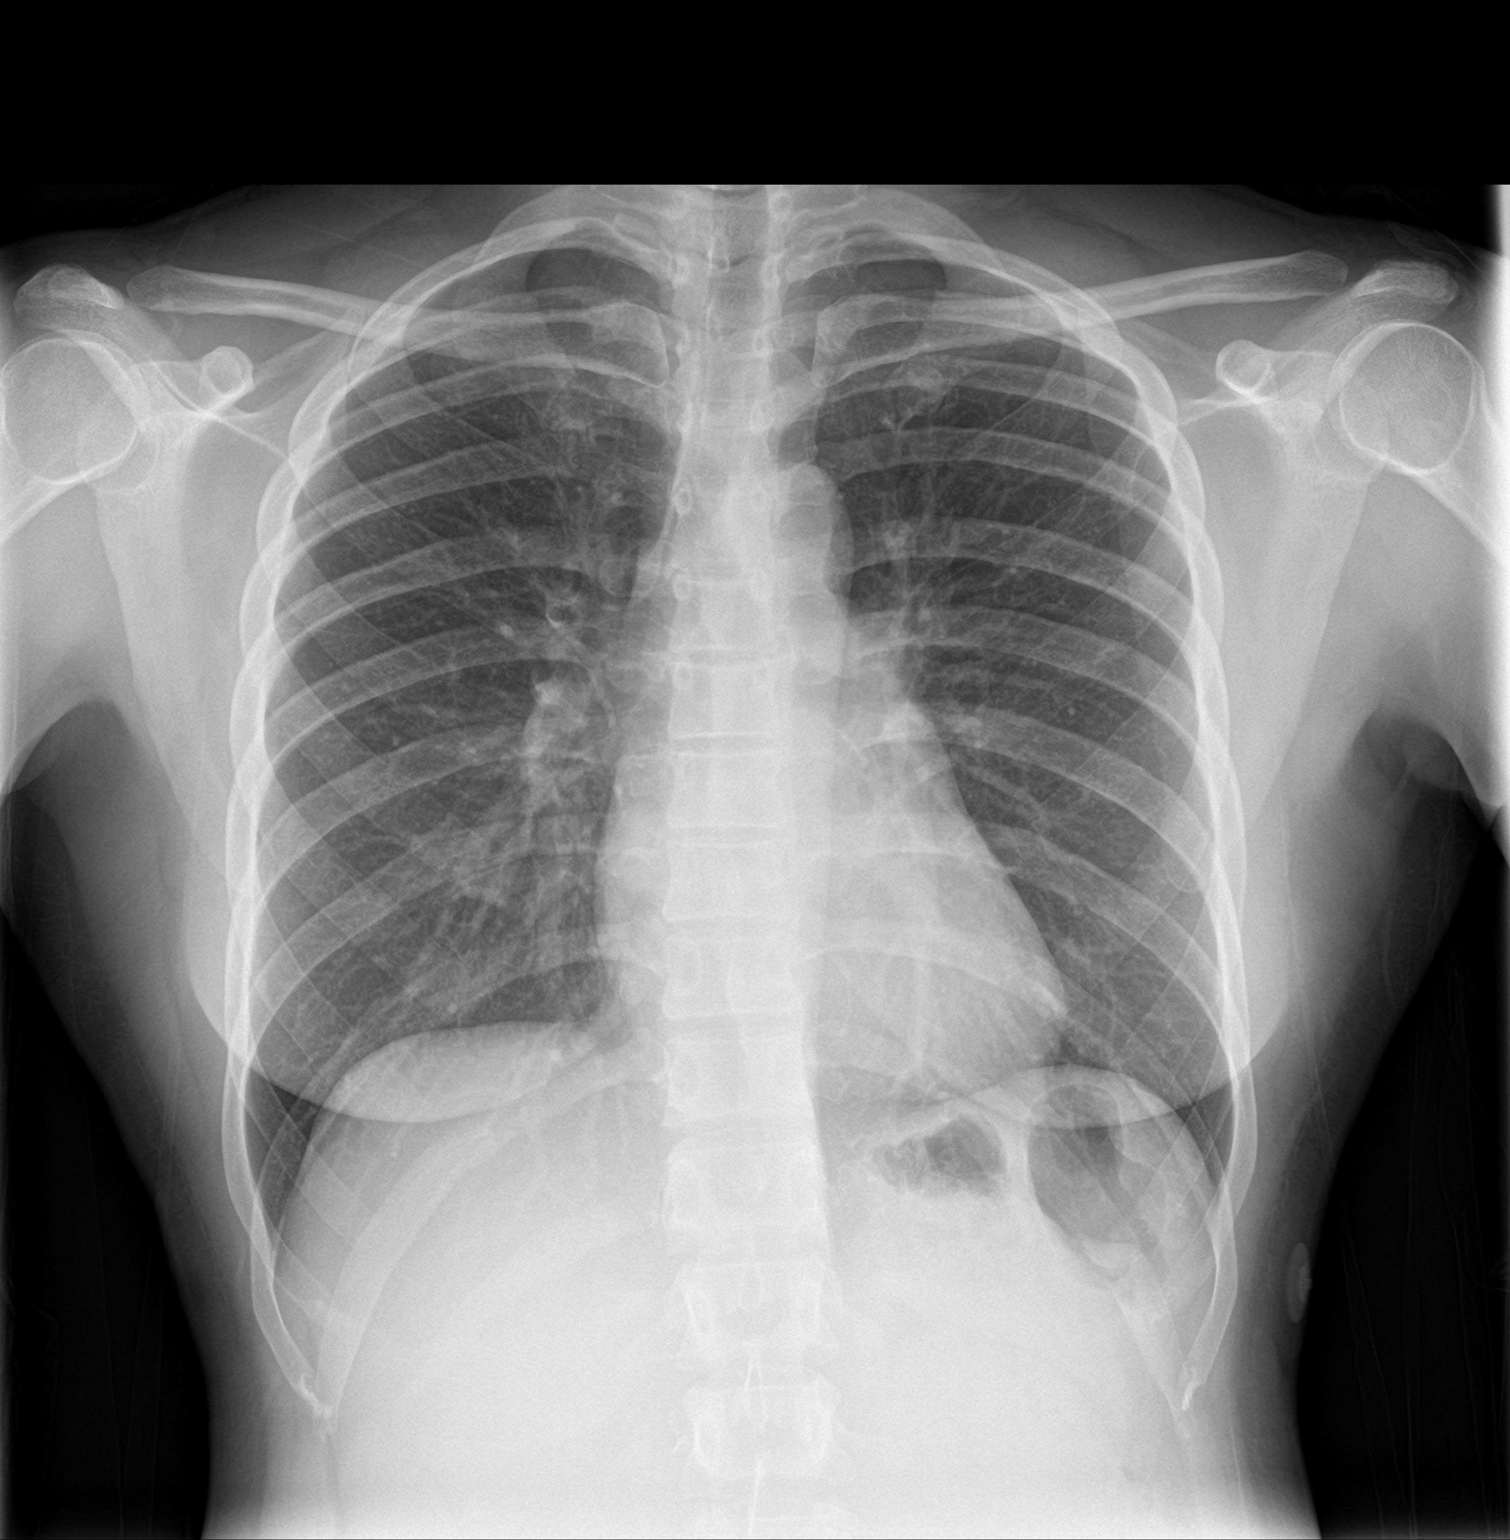

[chest lat]
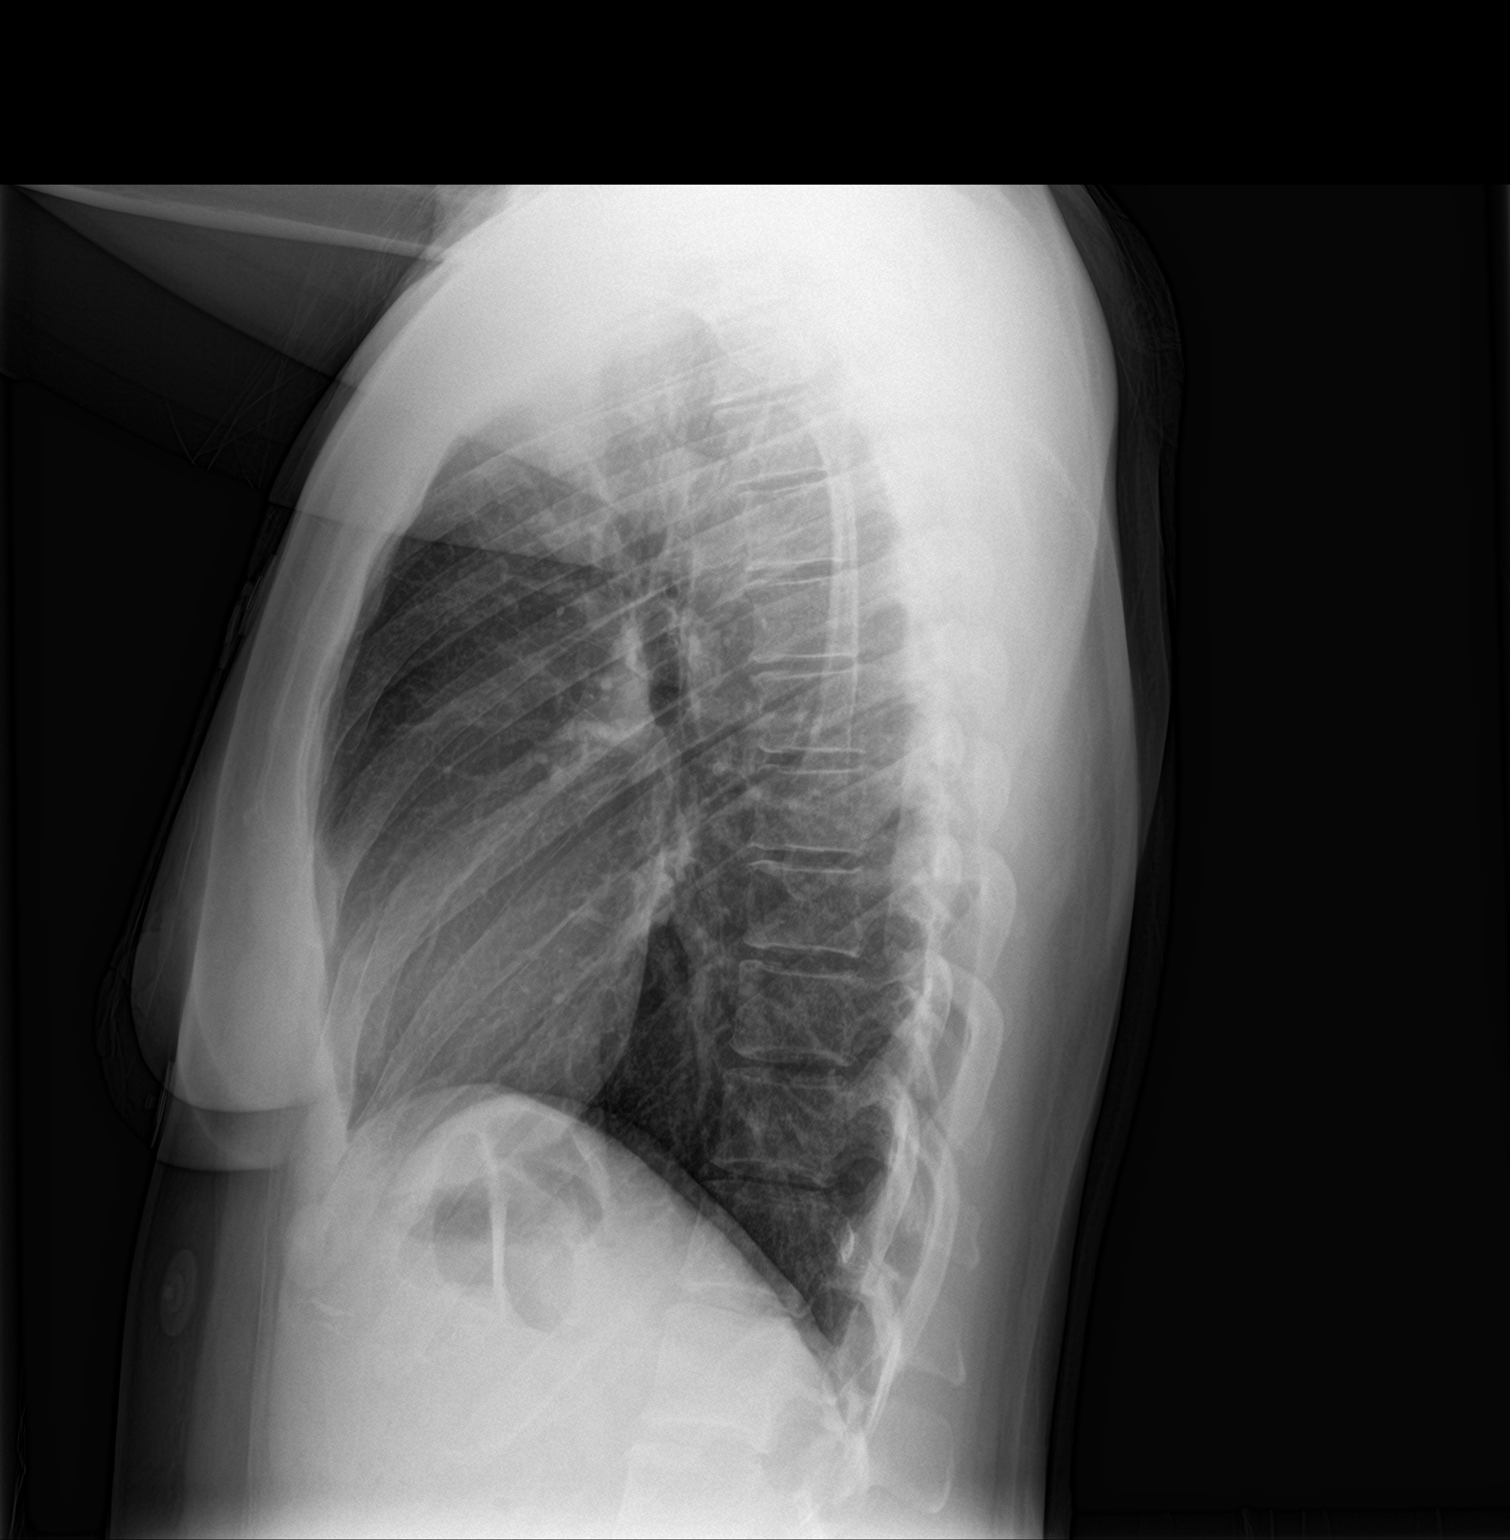

[2 of 2 positions shown; findings below may reference images not displayed]

FINDINGS: The cardiomediastinal contours are normal. The lungs are clear.
Pulmonary vasculature is normal. No consolidation, pleural effusion,
or pneumothorax. No acute osseous abnormalities are seen. EKG leads
overlie the chest.
IMPRESSION: Negative radiographs of the chest.

## 2022-10-01 IMAGING — MG MM DIGITAL DIAGNOSTIC UNILAT*R* W/ TOMO W/ CAD
6 series · 6 of 18 positions shown · non-contrast
Comparison: Previous exam(s).

CLINICAL DATA: Patient returns after screening study for evaluation
possible RIGHT breast distortion.

EXAM:
DIGITAL DIAGNOSTIC UNILATERAL RIGHT MAMMOGRAM WITH TOMOSYNTHESIS AND
CAD
TECHNIQUE: Right digital diagnostic mammography and breast tomosynthesis was
performed. The images were evaluated with computer-aided detection.

[R CC synth-2D]
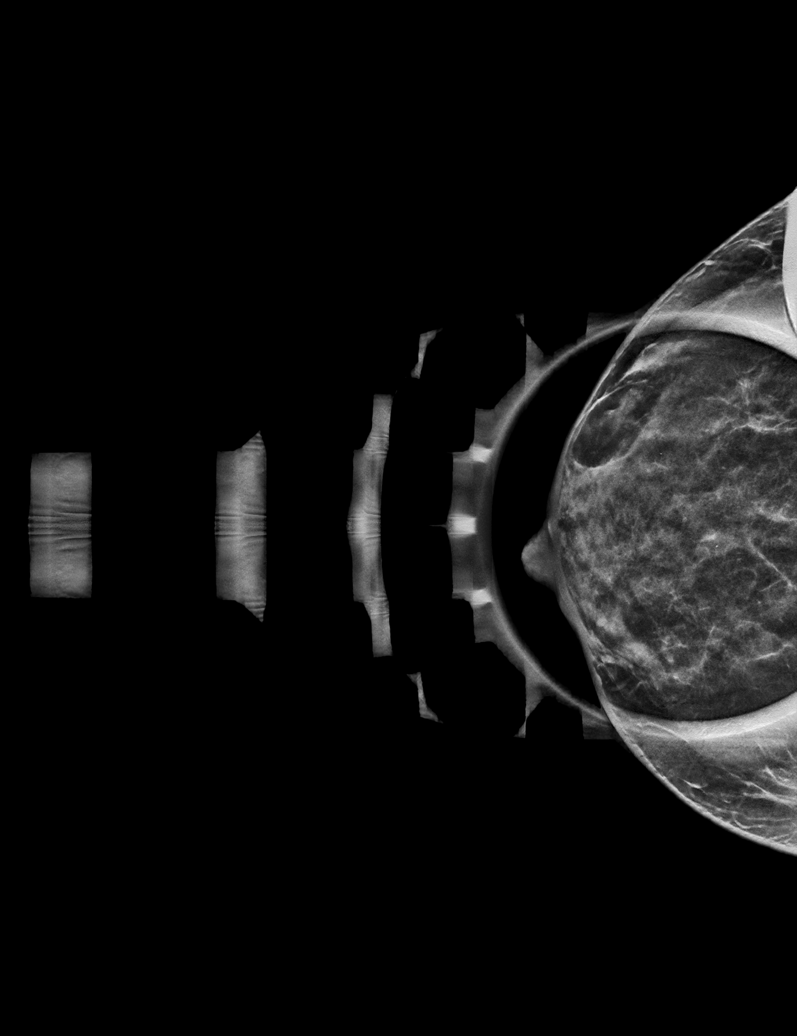

[R ML synth-2D]
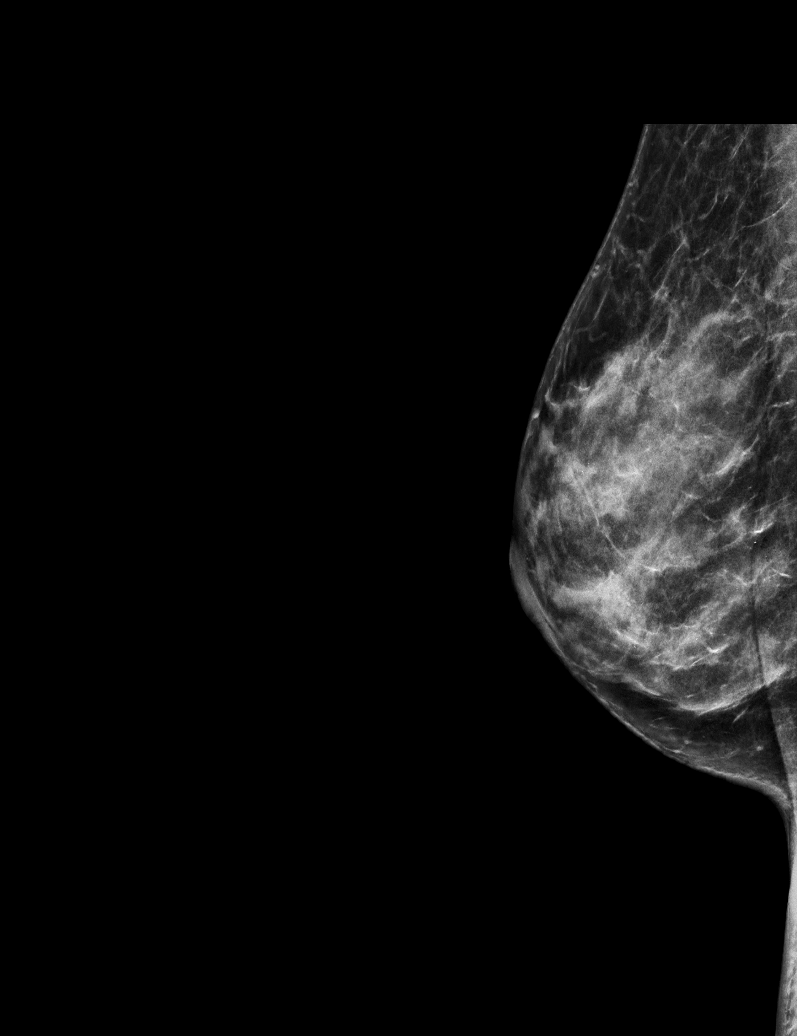

[R MLO synth-2D]
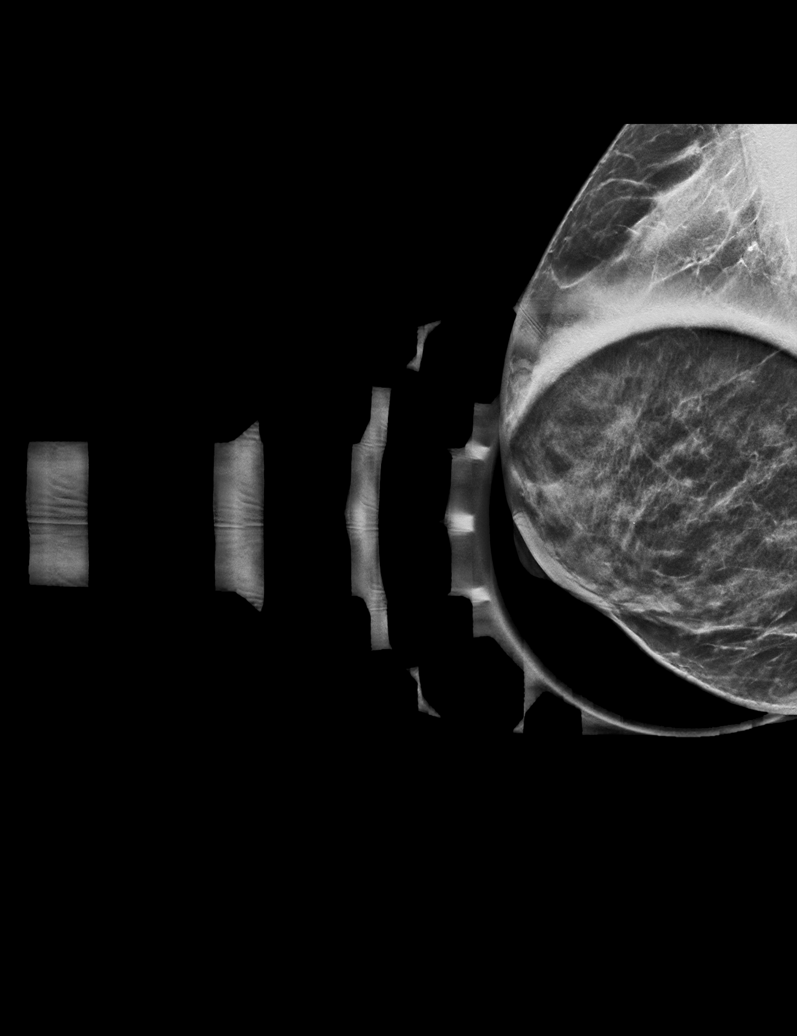

[R ML tomo · tomo slice 27/52.0]
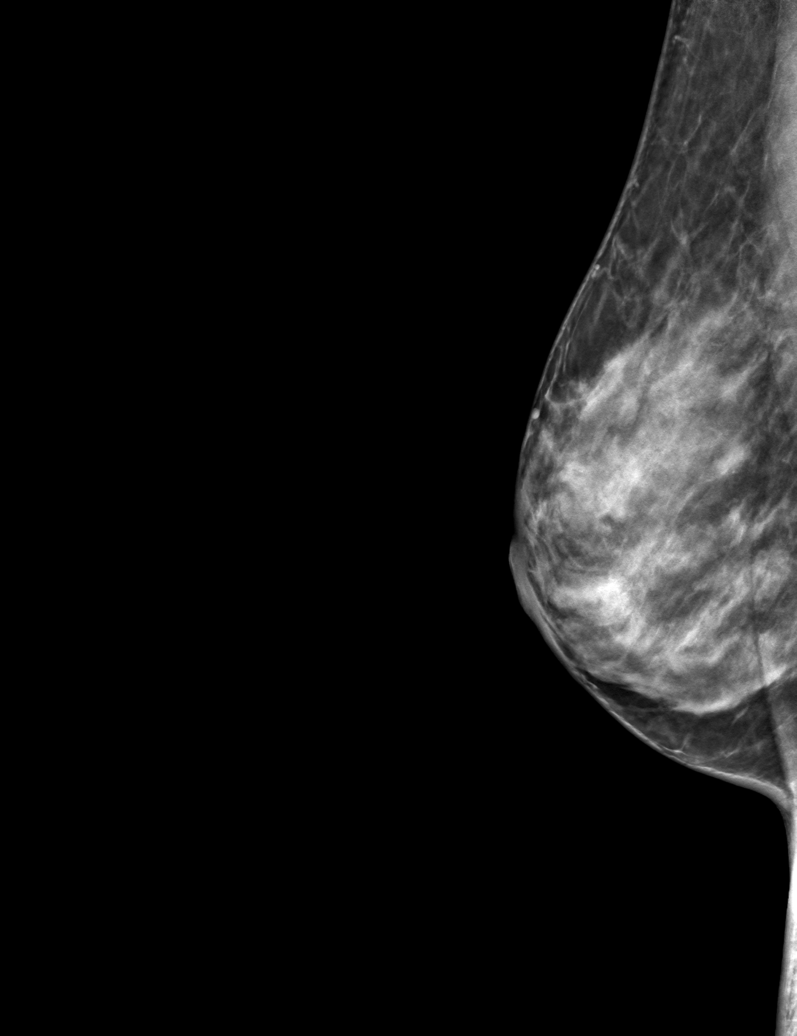

[R CC tomo · tomo slice 19/37.0]
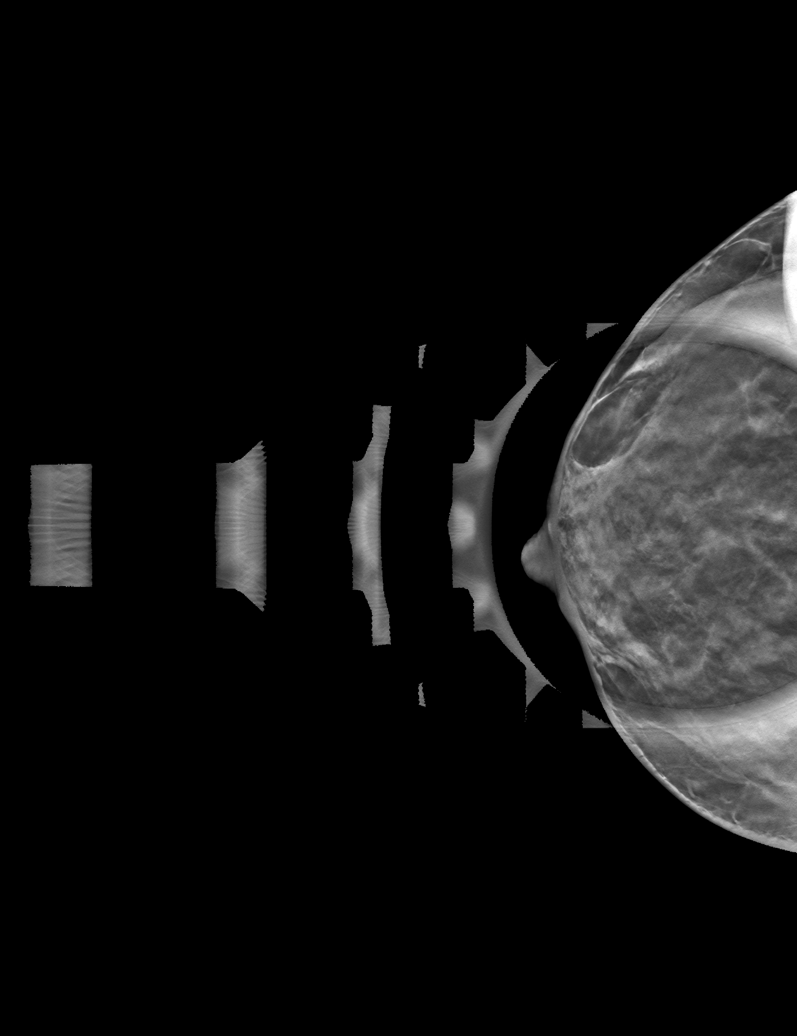

[R MLO tomo · tomo slice 20/39.0]
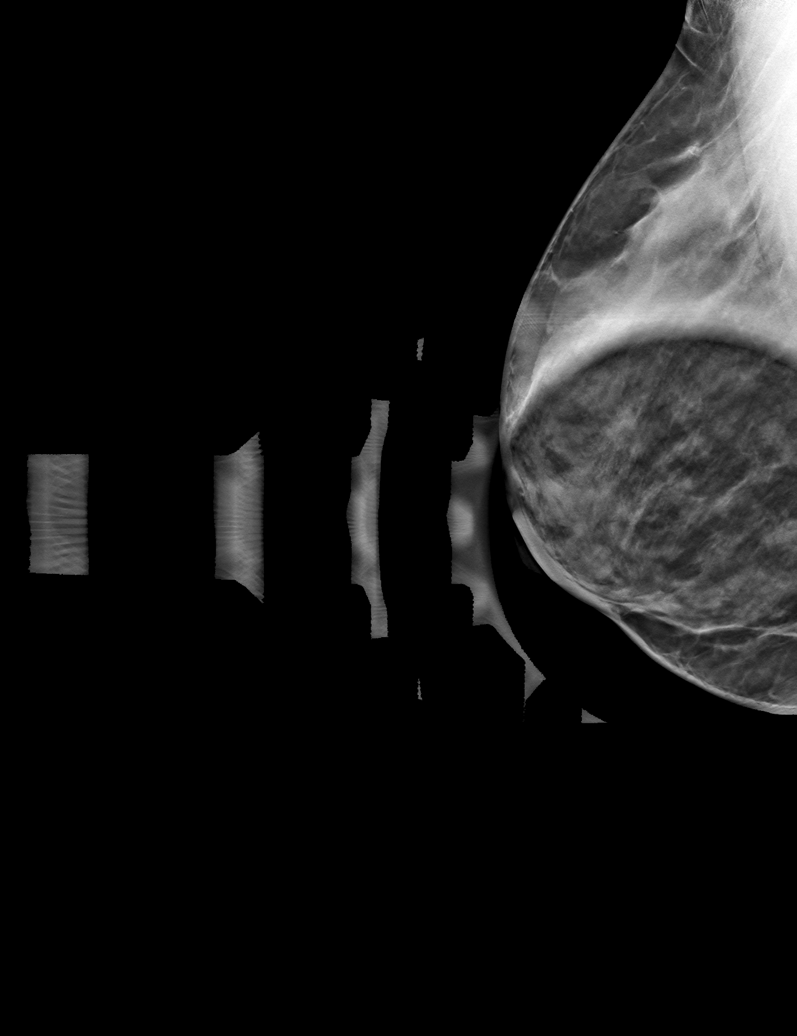

[6 of 18 positions shown; findings below may reference images not displayed]

ACR Breast Density Category c: The breast tissue is heterogeneously
dense, which may obscure small masses.
FINDINGS: Additional 2-D and 3-D images are performed. These views show no
persistent distortion in the retroareolar region of the RIGHT
breast. No suspicious mass, distortion, or microcalcifications are
identified to suggest presence of malignancy.
IMPRESSION: No mammographic evidence for malignancy.

RECOMMENDATION:
Screening mammogram in one year.(Code:BU-J-SY2)

I have discussed the findings and recommendations with the patient.
If applicable, a reminder letter will be sent to the patient
regarding the next appointment.

BI-RADS CATEGORY  1: Negative.
